# Patient Record
Sex: Female | Born: 1965 | Race: White | Hispanic: No | Marital: Married | Smoking: Never smoker
Health system: Southern US, Community
[De-identification: ages and names within clinical notes are randomized; demographics above are authoritative.]

## PROBLEM LIST (undated history)

## (undated) DIAGNOSIS — N83209 Unspecified ovarian cyst, unspecified side: Secondary | ICD-10-CM

## (undated) DIAGNOSIS — E669 Obesity, unspecified: Secondary | ICD-10-CM

## (undated) DIAGNOSIS — T7840XA Allergy, unspecified, initial encounter: Secondary | ICD-10-CM

## (undated) DIAGNOSIS — M1990 Unspecified osteoarthritis, unspecified site: Secondary | ICD-10-CM

## (undated) DIAGNOSIS — F419 Anxiety disorder, unspecified: Secondary | ICD-10-CM

## (undated) DIAGNOSIS — I10 Essential (primary) hypertension: Secondary | ICD-10-CM

## (undated) HISTORY — DX: Essential (primary) hypertension: I10

## (undated) HISTORY — DX: Allergy, unspecified, initial encounter: T78.40XA

## (undated) HISTORY — DX: Obesity, unspecified: E66.9

## (undated) HISTORY — DX: Unspecified osteoarthritis, unspecified site: M19.90

## (undated) HISTORY — PX: TUBAL LIGATION: SHX77

## (undated) HISTORY — PX: OOPHORECTOMY: SHX86

## (undated) HISTORY — DX: Unspecified ovarian cyst, unspecified side: N83.209

## (undated) HISTORY — DX: Anxiety disorder, unspecified: F41.9

---

## 2004-12-21 ENCOUNTER — Ambulatory Visit: Payer: Self-pay

## 2010-06-18 ENCOUNTER — Ambulatory Visit: Payer: Self-pay

## 2010-06-29 ENCOUNTER — Ambulatory Visit: Payer: Self-pay | Admitting: Internal Medicine

## 2010-11-26 ENCOUNTER — Ambulatory Visit: Payer: Self-pay | Admitting: Internal Medicine

## 2011-07-13 ENCOUNTER — Ambulatory Visit: Payer: Self-pay | Admitting: Internal Medicine

## 2012-07-27 ENCOUNTER — Ambulatory Visit: Payer: Self-pay | Admitting: Family Medicine

## 2013-04-10 LAB — HM PAP SMEAR

## 2013-12-24 ENCOUNTER — Ambulatory Visit: Payer: Self-pay | Admitting: Family Medicine

## 2013-12-30 ENCOUNTER — Ambulatory Visit: Payer: Self-pay | Admitting: Family Medicine

## 2014-04-04 ENCOUNTER — Ambulatory Visit: Payer: Self-pay | Admitting: Family Medicine

## 2014-04-04 LAB — HM MAMMOGRAPHY

## 2015-08-27 DIAGNOSIS — I10 Essential (primary) hypertension: Secondary | ICD-10-CM | POA: Insufficient documentation

## 2015-08-27 DIAGNOSIS — E669 Obesity, unspecified: Secondary | ICD-10-CM | POA: Insufficient documentation

## 2015-08-27 DIAGNOSIS — I152 Hypertension secondary to endocrine disorders: Secondary | ICD-10-CM | POA: Insufficient documentation

## 2015-08-29 ENCOUNTER — Ambulatory Visit: Payer: Self-pay | Admitting: Family Medicine

## 2015-09-01 ENCOUNTER — Encounter: Payer: Self-pay | Admitting: Family Medicine

## 2015-09-01 ENCOUNTER — Ambulatory Visit (INDEPENDENT_AMBULATORY_CARE_PROVIDER_SITE_OTHER): Payer: No Typology Code available for payment source | Admitting: Family Medicine

## 2015-09-01 VITALS — BP 147/86 | HR 74 | Temp 98.1°F | Ht 63.8 in | Wt 219.0 lb

## 2015-09-01 DIAGNOSIS — R399 Unspecified symptoms and signs involving the genitourinary system: Secondary | ICD-10-CM

## 2015-09-01 DIAGNOSIS — I10 Essential (primary) hypertension: Secondary | ICD-10-CM | POA: Diagnosis not present

## 2015-09-01 DIAGNOSIS — N898 Other specified noninflammatory disorders of vagina: Secondary | ICD-10-CM

## 2015-09-01 DIAGNOSIS — L298 Other pruritus: Secondary | ICD-10-CM | POA: Diagnosis not present

## 2015-09-01 DIAGNOSIS — R899 Unspecified abnormal finding in specimens from other organs, systems and tissues: Secondary | ICD-10-CM | POA: Diagnosis not present

## 2015-09-01 MED ORDER — NYSTATIN 100000 UNIT/GM EX CREA: 1.0000 "application " | TOPICAL_CREAM | Freq: Two times a day (BID) | CUTANEOUS | Status: DC

## 2015-09-01 NOTE — Patient Instructions (Addendum)
Use cotton panties Let things air out Use the new cream Return in one month for recheck Try the DASH guidelines DASH Eating Plan DASH stands for "Dietary Approaches to Stop Hypertension." The DASH eating plan is a healthy eating plan that has been shown to reduce high blood pressure (hypertension). Additional health benefits may include reducing the risk of type 2 diabetes mellitus, heart disease, and stroke. The DASH eating plan may also help with weight loss. WHAT DO I NEED TO KNOW ABOUT THE DASH EATING PLAN? For the DASH eating plan, you will follow these general guidelines:  Choose foods with a percent daily value for sodium of less than 5% (as listed on the food label).  Use salt-free seasonings or herbs instead of table salt or sea salt.  Check with your health care provider or pharmacist before using salt substitutes.  Eat lower-sodium products, often labeled as "lower sodium" or "no salt added."  Eat fresh foods.  Eat more vegetables, fruits, and low-fat dairy products.  Choose whole grains. Look for the word "whole" as the first word in the ingredient list.  Choose fish and skinless chicken or [place] more often than red meat. Limit fish, poultry, and meat to 6 oz (170 g) each day.  Limit sweets, desserts, sugars, and sugary drinks.  Choose heart-healthy fats.  Limit cheese to 1 oz (28 g) per day.  Eat more home-cooked food and less restaurant, buffet, and fast food.  Limit fried foods.  Cook foods using methods other than frying.  Limit canned vegetables. If you do use them, rinse them well to decrease the sodium.  When eating at a restaurant, ask that your food be prepared with less salt, or no salt if possible. WHAT FOODS CAN I EAT? Seek help from a dietitian for individual calorie needs. Grains Whole grain or whole wheat bread. Brown rice. Whole grain or whole wheat pasta. Quinoa, bulgur, and whole grain cereals. Low-sodium cereals. Corn or whole wheat flour  tortillas. Whole grain cornbread. Whole grain crackers. Low-sodium crackers. Vegetables Fresh or frozen vegetables (raw, steamed, roasted, or grilled). Low-sodium or reduced-sodium tomato and vegetable juices. Low-sodium or reduced-sodium tomato sauce and paste. Low-sodium or reduced-sodium canned vegetables.  Fruits All fresh, canned (in natural juice), or frozen fruits. Meat and Other Protein Products Ground beef (85% or leaner), grass-fed beef, or beef trimmed of fat. Skinless chicken or [place]. Ground chicken or [place]key. Pork trimmed of fat. All fish and seafood. Eggs. Dried beans, peas, or lentils. Unsalted nuts and seeds. Unsalted canned beans. Dairy Low-fat dairy products, such as skim or 1% milk, 2% or reduced-fat cheeses, low-fat ricotta or cottage cheese, or plain low-fat yogurt. Low-sodium or reduced-sodium cheeses. Fats and Oils Tub margarines without trans fats. Light or reduced-fat mayonnaise and salad dressings (reduced sodium). Avocado. Safflower, olive, or canola oils. Natural peanut or almond butter. Other Unsalted popcorn and pretzels. The items listed above may not be a complete list of recommended foods or beverages. Contact your dietitian for more options. WHAT FOODS ARE NOT RECOMMENDED? Grains White bread. White pasta. White rice. Refined cornbread. Bagels and croissants. Crackers that contain trans fat. Vegetables Creamed or fried vegetables. Vegetables in a cheese sauce. Regular canned vegetables. Regular canned tomato sauce and paste. Regular tomato and vegetable juices. Fruits Dried fruits. Canned fruit in light or heavy syrup. Fruit juice. Meat and Other Protein Products Fatty cuts of meat. Ribs, chicken wings, bacon, sausage, bologna, salami, chitterlings, fatback, hot dogs, bratwurst, and packaged luncheon meats. Salted nuts  and seeds. Canned beans with salt. Dairy Whole or 2% milk, cream, half-and-half, and cream cheese. Whole-fat or sweetened yogurt. Full-fat  cheeses or blue cheese. Nondairy creamers and whipped toppings. Processed cheese, cheese spreads, or cheese curds. Condiments Onion and garlic salt, seasoned salt, table salt, and sea salt. Canned and packaged gravies. Worcestershire sauce. Tartar sauce. Barbecue sauce. Teriyaki sauce. Soy sauce, including reduced sodium. Steak sauce. Fish sauce. Oyster sauce. Cocktail sauce. Horseradish. Ketchup and mustard. Meat flavorings and tenderizers. Bouillon cubes. Hot sauce. Tabasco sauce. Marinades. Taco seasonings. Relishes. Fats and Oils Butter, stick margarine, lard, shortening, ghee, and bacon fat. Coconut, palm kernel, or palm oils. Regular salad dressings. Other Pickles and olives. Salted popcorn and pretzels. The items listed above may not be a complete list of foods and beverages to avoid. Contact your dietitian for more information. WHERE CAN I FIND MORE INFORMATION? National Heart, Lung, and Blood Institute: travelstabloid.com   This information is not intended to replace advice given to you by your health care provider. Make sure you discuss any questions you have with your health care provider.   Document Released: 10/07/2011 Document Revised: 11/08/2014 Document Reviewed: 08/22/2013 Elsevier Interactive Patient Education [employer].

## 2015-09-01 NOTE — Progress Notes (Signed)
BP 147/86 mmHg  Pulse 74  Temp(Src) 98.1 F (36.7 C)  Ht 5' 3.8" (1.621 m)  Wt 219 lb (99.338 kg)  BMI 37.81 kg/m2  SpO2 97%  LMP 08/05/2015 (Approximate)   Subjective:    Patient ID: Felicia Everett, female    DOB: 1965-12-13, 49 y.o.   MRN: [id number]  HPI: Felicia Everett is a 49 y.o. female  Chief Complaint  Patient presents with  . Vaginitis   She has noticed something some issues down below for 4-5 months; rash on the outside; not any itching or burning on the inside; used some baby powder and it helped; still a little pink but not red and itchy; no pelvic pain or pressure; she did not have a period for about 8 months, then got a period in Sept and October; regular periods, not weird spotting; no burning with urination; BMs are regular; no fevers; just a little nauseated this month; sexually active, but has tubes tied  Her BP is up just a little; had a hot dog and Dr. Reino Kent not long ago; checking BP at home, not taking any BP right now; taking aleve for stress fracture; check at home once a week, 134/76 usually  She has a stress fracture on her heel; Dr. Fransisco Hertz; right foot; wears a boot at work, not right now  She has gained weight, not as mobile with stress fracture; no really dry mouth; not urinating at night  Relevant past medical, surgical, family and social history reviewed and updated as indicated. Interim medical history since our last visit reviewed. Allergies and medications reviewed and updated.  Review of Systems Per HPI unless specifically indicated above     Objective:    BP 147/86 mmHg  Pulse 74  Temp(Src) 98.1 F (36.7 C)  Ht 5' 3.8" (1.621 m)  Wt 219 lb (99.338 kg)  BMI 37.81 kg/m2  SpO2 97%  LMP 08/05/2015 (Approximate)  Wt Readings from Last 3 Encounters:  09/01/15 219 lb (99.338 kg)  06/28/14 208 lb (94.348 kg)    Physical Exam  Constitutional: She appears well-developed and well-nourished.  obese  Cardiovascular: Normal rate.    Pulmonary/Chest: Effort normal and breath sounds normal.  Abdominal: Soft. Bowel sounds are normal. She exhibits no distension. There is no tenderness. There is no guarding.  Genitourinary: There is rash on the right labia. There is rash on the left labia. Vaginal discharge (very scant, clear, no odor) found.  Erythematous tiny excoriations and rash on the labia majora superiorly; nestled in the vestibule, labia minora, there is slightly macerated, lightly colored (but not white) tissue, very slightly papular with clear margins; no erythema, no vesicular component; no fimbriae  Musculoskeletal: She exhibits no edema.  Skin: No rash noted.  Psychiatric: She has a normal mood and affect.    Results for orders placed or performed in visit on 09/01/15  WET PREP FOR TRICH, YEAST, CLUE  Result Value Ref Range   Trichomonas Exam Negative Negative   Yeast Exam Negative Negative   Clue Cell Exam Negative Negative      Assessment & Plan:   Problem List Items Addressed This Visit      Cardiovascular and Mediastinum   Hypertension    Not controlled; DASH guidelines recommended; weigh tloss, healthier eating; recheck in one month        Musculoskeletal and Integument   Vaginal itching - Primary    External irritation; start Rx, avoid excessive moisture, friction, return for re-evaluation in one month,  sooner if needed      Relevant Orders   WET PREP FOR TRICH, YEAST, CLUE (Completed)   Ambulatory referral to Gynecology     Other   Abnormal genitourinary finding    Tissue in the vaginal vestibule, cleft along labia minora appears macerated, but not verrucous; very symmetric, "kissing" type lesions; does not appear typical for condyloma or LSA; will have her use new cream to treat the external itching; use cotton panties, see if symptoms are from excessive moisture; not typical for condyloma; I initially thought I would just re-exam in one month (since she says this has been going on for  about 5 months, not a rapid evolution, apparently); work on weight loss; however, will refer to GYN for evaluation, consideration of biopsy; she agrees with treatment recommendations; avoid use of talcum powder      Relevant Orders   Ambulatory referral to Gynecology   RESOLVED: Vaginal discharge      Follow up plan: Return in about 1 month (around 10/01/2015) for recheck.  An after-visit summary was printed and given to the patient at check-out.  Please see the patient instructions which may contain other information and recommendations beyond what is mentioned above in the assessment and plan.

## 2015-09-02 DIAGNOSIS — N83209 Unspecified ovarian cyst, unspecified side: Secondary | ICD-10-CM

## 2015-09-02 DIAGNOSIS — N898 Other specified noninflammatory disorders of vagina: Secondary | ICD-10-CM | POA: Insufficient documentation

## 2015-09-02 HISTORY — DX: Unspecified ovarian cyst, unspecified side: N83.209

## 2015-09-02 LAB — WET PREP FOR TRICH, YEAST, CLUE
Clue Cell Exam: NEGATIVE
Trichomonas Exam: NEGATIVE
YEAST EXAM: NEGATIVE

## 2015-09-04 DIAGNOSIS — R399 Unspecified symptoms and signs involving the genitourinary system: Secondary | ICD-10-CM | POA: Insufficient documentation

## 2015-09-04 NOTE — Assessment & Plan Note (Addendum)
Not controlled; DASH guidelines recommended; weight loss, healthier eating; recheck in one month; NSAID likely not helping

## 2015-09-04 NOTE — Assessment & Plan Note (Addendum)
Tissue in the vaginal vestibule, cleft along labia minora appears macerated, but not verrucous; very symmetric, "kissing" type lesions; does not appear typical for condyloma or LSA; will have her use new cream to treat the external itching; use cotton panties, see if symptoms are from excessive moisture; not typical for condyloma; I initially thought I would just re-exam in one month (since she says this has been going on for about 5 months, not a rapid evolution, apparently); work on weight loss; however, will refer to GYN for evaluation, consideration of biopsy; she agrees with treatment recommendations; avoid use of talcum powder

## 2015-09-04 NOTE — Assessment & Plan Note (Signed)
External irritation; start Rx, avoid excessive moisture, friction, return for re-evaluation in one month, sooner if needed

## 2015-09-12 ENCOUNTER — Telehealth: Payer: Self-pay

## 2015-09-12 NOTE — Telephone Encounter (Signed)
Patient called to advise that she will not be going to the [facility].  Patient states that she would [facility] and that she would prefer to schedule her own appointment.  Patient states that she will keep her follow up here on 10/01/15 but probably will not go to the GYN until after the first of the year.

## 2015-10-01 ENCOUNTER — Ambulatory Visit: Payer: Managed Care, Other (non HMO) | Admitting: Family Medicine

## 2015-10-22 ENCOUNTER — Encounter: Payer: Managed Care, Other (non HMO) | Admitting: Obstetrics and Gynecology

## 2016-06-03 ENCOUNTER — Encounter: Payer: Self-pay | Admitting: Family Medicine

## 2016-06-03 ENCOUNTER — Ambulatory Visit (INDEPENDENT_AMBULATORY_CARE_PROVIDER_SITE_OTHER): Payer: Managed Care, Other (non HMO) | Admitting: Family Medicine

## 2016-06-03 VITALS — Temp 98.3°F | Wt 223.0 lb

## 2016-06-03 DIAGNOSIS — R42 Dizziness and giddiness: Secondary | ICD-10-CM | POA: Diagnosis not present

## 2016-06-03 DIAGNOSIS — R252 Cramp and spasm: Secondary | ICD-10-CM | POA: Diagnosis not present

## 2016-06-03 NOTE — Patient Instructions (Signed)
Follow up as needed

## 2016-06-03 NOTE — Progress Notes (Signed)
Temp 98.3 F (36.8 C)   Wt 223 lb (101.2 kg)   SpO2 95%   BMI 38.52 kg/m    Subjective:    Patient ID: Felicia Everett, female    DOB: [birth year], 50 y.o.   MRN: [id number]  HPI: Felicia Everett is a 50 y.o. female  Chief Complaint  Patient presents with  . Dizziness    since saturday late, nauseated, shakey, dizzy (no room spinning), fatigue. Right shoulder/axillary pain. Also dieting and skipping meals.  . Other    she's been having some abdominal, hands, and leg cramps. Has had low vitamin levels in the past.   Patient presents with a 5 day history of nausea, dizziness, fatigue, and muscle cramping. Did have a few quick episodes of sharp R sided chest/axilla pain as well. Denies feeling like the room is spinning, but states the dizziness mostly happens upon standing up from sitting. Only change of late has been that she decided she wanted to drop weight quickly, so she has been eating very minimal food the past week or two and not drinking much water as her job is busy and does not allow her to keep water nearby. Will eat maybe 500 calories a day now. Not taking anything OTC for symptoms.   Relevant past medical, surgical, family and social history reviewed and updated as indicated. Interim medical history since our last visit reviewed. Allergies and medications reviewed and updated.  Past Medical History:  Diagnosis Date  . Hypertension   . Obesity    Social History   Social History  . Marital status: Married    Spouse name: N/A  . Number of children: N/A  . Years of education: N/A   Occupational History  . Not on file.   Social History Main Topics  . Smoking status: Never Smoker  . Smokeless tobacco: Never Used  . Alcohol use No  . Drug use: No  . Sexual activity: Not on file   Other Topics Concern  . Not on file   Social History Narrative  . No narrative on file   Review of Systems  Constitutional: Positive for fatigue.  HENT: Negative.   Respiratory:  Negative.   Cardiovascular: Positive for chest pain.  Gastrointestinal: Positive for nausea.  Genitourinary: Negative.   Musculoskeletal: Positive for myalgias.  Neurological: Positive for dizziness.  Psychiatric/Behavioral: Negative.     Per HPI unless specifically indicated above     Objective:    Temp 98.3 F (36.8 C)   Wt 223 lb (101.2 kg)   SpO2 95%   BMI 38.52 kg/m   Wt Readings from Last 3 Encounters:  06/03/16 223 lb (101.2 kg)  09/01/15 219 lb (99.3 kg)  06/28/14 208 lb (94.3 kg)    Physical Exam  Constitutional: She is oriented to person, place, and time. She appears well-developed and well-nourished.  HENT:  Head: Atraumatic.  Eyes: Conjunctivae are normal. No scleral icterus.  Neck: Normal range of motion. Neck supple.  Cardiovascular: Normal rate, regular rhythm and normal heart sounds.   Pulmonary/Chest: Effort normal and breath sounds normal.  Abdominal: Soft. Bowel sounds are normal. There is no tenderness.  Musculoskeletal: Normal range of motion.  Neurological: She is alert and oriented to person, place, and time.  Skin: Skin is warm and dry.  Psychiatric: She has a normal mood and affect. Her behavior is normal.  Nursing note and vitals reviewed.  EKG and orthostatics benign.     Assessment & Plan:  Problem List Items Addressed This Visit    None    Visit Diagnoses    Muscle cramp    -  Primary   Relevant Orders   CBC with Differential/Platelet   Comprehensive metabolic panel   Vitamin B12   VITAMIN D 25 Hydroxy (Vit-D Deficiency, Fractures)   Magnesium   Dizziness       Relevant Orders   EKG 12-Lead (Completed)    Likely an orthostatic component given poor hydration status recently, possibly some hypoglycemic episodes as well d/t poor PO intake the past week or so. Await lab results. Discussed with patient the importance of taking in appropriate calories and nutrients, patient agreeable to improving current dietary habits. Also  encouraged better water intake to stay hydrated. Patient declined nutrition referral at this time as she saw one last year.   Follow up plan: Return if symptoms worsen or fail to improve.  Schedule PE for patient's earliest convenience.

## 2016-06-04 LAB — VITAMIN B12: Vitamin B-12: 550 pg/mL (ref 211–946)

## 2016-06-04 LAB — COMPREHENSIVE METABOLIC PANEL
A/G RATIO: 1.2 (ref 1.2–2.2)
ALBUMIN: 4.2 g/dL (ref 3.5–5.5)
ALT: 26 IU/L (ref 0–32)
AST: 20 IU/L (ref 0–40)
Alkaline Phosphatase: 76 IU/L (ref 39–117)
BILIRUBIN TOTAL: 0.3 mg/dL (ref 0.0–1.2)
BUN / CREAT RATIO: 23 (ref 9–23)
BUN: 12 mg/dL (ref 6–24)
CALCIUM: 9.6 mg/dL (ref 8.7–10.2)
CHLORIDE: 101 mmol/L (ref 96–106)
CO2: 26 mmol/L (ref 18–29)
Creatinine, Ser: 0.53 mg/dL — ABNORMAL LOW (ref 0.57–1.00)
GFR, EST AFRICAN AMERICAN: 129 mL/min/{1.73_m2} (ref >59)
GFR, EST NON AFRICAN AMERICAN: 112 mL/min/{1.73_m2} (ref >59)
GLOBULIN, TOTAL: 3.4 g/dL (ref 1.5–4.5)
Glucose: 113 mg/dL — ABNORMAL HIGH (ref 65–99)
POTASSIUM: 4.4 mmol/L (ref 3.5–5.2)
SODIUM: 142 mmol/L (ref 134–144)
TOTAL PROTEIN: 7.6 g/dL (ref 6.0–8.5)

## 2016-06-04 LAB — CBC WITH DIFFERENTIAL/PLATELET
BASOS: 0 %
Basophils Absolute: 0 10*3/uL (ref 0.0–0.2)
EOS (ABSOLUTE): 0.2 10*3/uL (ref 0.0–0.4)
EOS: 2 %
HEMATOCRIT: 38.2 % (ref 34.0–46.6)
Hemoglobin: 12.6 g/dL (ref 11.1–15.9)
IMMATURE GRANS (ABS): 0 10*3/uL (ref 0.0–0.1)
Immature Granulocytes: 0 %
LYMPHS: 22 %
Lymphocytes Absolute: 2.2 10*3/uL (ref 0.7–3.1)
MCH: 29.4 pg (ref 26.6–33.0)
MCHC: 33 g/dL (ref 31.5–35.7)
MCV: 89 fL (ref 79–97)
Monocytes Absolute: 0.7 10*3/uL (ref 0.1–0.9)
Monocytes: 7 %
NEUTROS ABS: 7 10*3/uL (ref 1.4–7.0)
Neutrophils: 69 %
PLATELETS: 302 10*3/uL (ref 150–379)
RBC: 4.28 x10E6/uL (ref 3.77–5.28)
RDW: 14.1 % (ref 12.3–15.4)
WBC: 10.1 10*3/uL (ref 3.4–10.8)

## 2016-06-04 LAB — VITAMIN D 25 HYDROXY (VIT D DEFICIENCY, FRACTURES): Vit D, 25-Hydroxy: 15.2 ng/mL — ABNORMAL LOW (ref 30.0–100.0)

## 2016-06-04 LAB — MAGNESIUM: Magnesium: 2 mg/dL (ref 1.6–2.3)

## 2016-06-07 ENCOUNTER — Telehealth: Payer: Self-pay | Admitting: Family Medicine

## 2016-06-07 ENCOUNTER — Encounter: Payer: Self-pay | Admitting: Family Medicine

## 2016-06-07 NOTE — Telephone Encounter (Signed)
Please let her know that all of her labs came back normal other than her Vitamin D level was low, which likely is just an incidental finding. Have her start an OTC Vitamin D3 supplement and continue working on good dietary habits. Thanks!

## 2016-06-07 NOTE — Telephone Encounter (Signed)
Patient notified

## 2016-08-02 ENCOUNTER — Encounter: Payer: Managed Care, Other (non HMO) | Admitting: Family Medicine

## 2016-11-30 ENCOUNTER — Ambulatory Visit (INDEPENDENT_AMBULATORY_CARE_PROVIDER_SITE_OTHER): Payer: Managed Care, Other (non HMO) | Admitting: Family Medicine

## 2016-11-30 ENCOUNTER — Encounter: Payer: Self-pay | Admitting: Family Medicine

## 2016-11-30 VITALS — BP 142/89 | HR 93 | Temp 98.3°F | Wt 229.0 lb

## 2016-11-30 DIAGNOSIS — L253 Unspecified contact dermatitis due to other chemical products: Secondary | ICD-10-CM

## 2016-11-30 DIAGNOSIS — K219 Gastro-esophageal reflux disease without esophagitis: Secondary | ICD-10-CM

## 2016-11-30 MED ORDER — OMEPRAZOLE 20 MG PO CPDR: 20.0000 mg | DELAYED_RELEASE_CAPSULE | Freq: Every day | ORAL | 3 refills | Status: DC

## 2016-11-30 MED ORDER — TRIAMCINOLONE ACETONIDE 0.1 % EX CREA: 1.0000 "application " | TOPICAL_CREAM | Freq: Two times a day (BID) | CUTANEOUS | 0 refills | Status: DC

## 2016-11-30 MED ORDER — TRIAMCINOLONE ACETONIDE 40 MG/ML IJ SUSP
40.0000 mg | Freq: Once | INTRAMUSCULAR | Status: AC
  Administered 2016-11-30: 40 mg via INTRAMUSCULAR

## 2016-11-30 NOTE — Patient Instructions (Signed)
Follow up as needed

## 2016-11-30 NOTE — Progress Notes (Signed)
BP (!) 142/89   Pulse 93   Temp 98.3 F (36.8 C)   Wt 229 lb (103.9 kg)   SpO2 97%   BMI 39.55 kg/m    Subjective:    Patient ID: Felicia Everett, female    DOB: [birth year], 51 y.o.   MRN: [id number]  HPI: Felicia Everett is a 51 y.o. female  Chief Complaint  Patient presents with  . Rash    x 2 weeks, face and ears only, itches, redness. Can't recall if she used a different makeup.   Patient presents with 2 day history of red, itchy rash on face. No new products, medications, food, or detergents. Does note that the day before she noticed the rash she had been blowing up a bunch of balloons for a baby shower and she has a latex allergy so she thinks that may have been the cause. Has been using hydrocortisone cream with minimal relief.   Also c/o new onset burning and brash in throat that is worst when laying down. Has never had reflux before but feels this is what is going on. Denies CP, SOB, abdominal pain, changes in stools. Tried some TUMS with some relief. Has been under stress lately and knows she isn't eating right.   Past Medical History:  Diagnosis Date  . Hypertension   . Obesity   . Ovarian cyst 09/2015   right side   Social History   Social History  . Marital status: Married    Spouse name: N/A  . Number of children: N/A  . Years of education: N/A   Occupational History  . Not on file.   Social History Main Topics  . Smoking status: Never Smoker  . Smokeless tobacco: Never Used  . Alcohol use No  . Drug use: No  . Sexual activity: Not on file   Other Topics Concern  . Not on file   Social History Narrative  . No narrative on file    Relevant past medical, surgical, family and social history reviewed and updated as indicated. Interim medical history since our last visit reviewed. Allergies and medications reviewed and updated.  Review of Systems  Constitutional: Negative.   HENT: Negative.   Eyes: Negative.   Respiratory: Negative.     Cardiovascular: Negative.   Gastrointestinal:       Esophageal discomfort, brash  Genitourinary: Negative.   Musculoskeletal: Negative.   Skin: Positive for rash.  Neurological: Negative.   Psychiatric/Behavioral: Negative.     Per HPI unless specifically indicated above     Objective:    BP (!) 142/89   Pulse 93   Temp 98.3 F (36.8 C)   Wt 229 lb (103.9 kg)   SpO2 97%   BMI 39.55 kg/m   Wt Readings from Last 3 Encounters:  11/30/16 229 lb (103.9 kg)  06/03/16 223 lb (101.2 kg)  09/01/15 219 lb (99.3 kg)    Physical Exam  Constitutional: She is oriented to person, place, and time. She appears well-developed and well-nourished. No distress.  HENT:  Head: Atraumatic.  Mouth/Throat: Oropharynx is clear and moist.  Eyes: Conjunctivae are normal. Pupils are equal, round, and reactive to light. No scleral icterus.  Neck: Normal range of motion. Neck supple.  Cardiovascular: Normal rate and normal heart sounds.   Pulmonary/Chest: Effort normal and breath sounds normal. No respiratory distress.  Abdominal: Soft. Bowel sounds are normal. She exhibits no distension. There is no tenderness.  Musculoskeletal: Normal range of motion.  Neurological:  She is alert and oriented to person, place, and time.  Skin: Skin is warm and dry. Rash (Diffuse erythematous macular rash focused around mouth and across forehead and eyelids) noted.  Psychiatric: She has a normal mood and affect. Her behavior is normal.  Nursing note and vitals reviewed.     Assessment & Plan:   Problem List Items Addressed This Visit    None    Visit Diagnoses    Allergic contact dermatitis due to latex    -  Primary   IM kenalog given today, triamcinolone cream sent. Recommended taking benadryl prn. Avoid latex products. F/u if no improvement   Relevant Medications   triamcinolone acetonide (KENALOG-40) injection 40 mg (Completed)   Gastroesophageal reflux disease, esophagitis presence not specified        Suspect having some gastritis as this is a new issue. Prilosec sent. Discussed taking 30 min before meal in morning. Can continue PRN zantac or TUMS   Relevant Medications   omeprazole (PRILOSEC) 20 MG capsule       Follow up plan: Return if symptoms worsen or fail to improve.

## 2018-09-26 ENCOUNTER — Encounter: Payer: Managed Care, Other (non HMO) | Admitting: Family Medicine

## 2018-11-20 ENCOUNTER — Ambulatory Visit: Payer: Self-pay

## 2020-01-16 ENCOUNTER — Ambulatory Visit: Payer: Self-pay | Admitting: Family Medicine

## 2020-01-23 ENCOUNTER — Ambulatory Visit (INDEPENDENT_AMBULATORY_CARE_PROVIDER_SITE_OTHER): Payer: Self-pay | Admitting: Family Medicine

## 2020-01-23 ENCOUNTER — Encounter: Payer: Self-pay | Admitting: Family Medicine

## 2020-01-23 ENCOUNTER — Other Ambulatory Visit: Payer: Self-pay

## 2020-01-23 ENCOUNTER — Telehealth: Payer: Self-pay | Admitting: Family Medicine

## 2020-01-23 VITALS — BP 164/94 | HR 102 | Temp 98.2°F | Ht 62.5 in | Wt 214.0 lb

## 2020-01-23 DIAGNOSIS — I10 Essential (primary) hypertension: Secondary | ICD-10-CM

## 2020-01-23 DIAGNOSIS — Z7689 Persons encountering health services in other specified circumstances: Secondary | ICD-10-CM

## 2020-01-23 DIAGNOSIS — F419 Anxiety disorder, unspecified: Secondary | ICD-10-CM

## 2020-01-23 NOTE — Telephone Encounter (Signed)
Please see where she needs order sent  Copied from CRM [id number]. Topic: General - Other >> Jan 23, 2020 12:10 PM Dalphine Handing A wrote: Patient stated that she needs [facility] to place order for mammogram she has scheduled on 3/31. Patient requesting callback once this has been done. Please advise

## 2020-01-23 NOTE — Progress Notes (Signed)
BP (!) 164/94   Pulse (!) 102   Temp 98.2 F (36.8 C) (Oral)   Ht 5' 2.5" (1.588 m)   Wt 214 lb (97.1 kg)   LMP 08/05/2015 (Approximate)   SpO2 98%   BMI 38.52 kg/m    Subjective:    Patient ID: Felicia Everett, female    DOB: 12/26/1965, 54 y.o.   MRN: [id number]  HPI: Felicia Everett is a 54 y.o. female  Chief Complaint  Patient presents with  . Establish Care    pt would like to discuss about allergies and get a mammogram  . Anxiety   Patient presenting today to establish care.   Having some issues with anxiety since her daughter's issues with drug abuse and taking custody of young grandson. Has been going to rehabilitation counseling here and there. Has not tried any medication for this issue. Denies SI/HI, frequent panic attacks.   No known past medical history other than allergic rhinitis for which she takes claritin prn with good control and some intermittent issues with high blood pressures in the past.   PHQ-9 and GAD7 were not readable to input into patient's chart, will try to re-obtain at next OV No flowsheet data found.  No flowsheet data found.  Relevant past medical, surgical, family and social history reviewed and updated as indicated. Interim medical history since our last visit reviewed. Allergies and medications reviewed and updated.  Review of Systems  Per HPI unless specifically indicated above     Objective:    BP (!) 164/94   Pulse (!) 102   Temp 98.2 F (36.8 C) (Oral)   Ht 5' 2.5" (1.588 m)   Wt 214 lb (97.1 kg)   LMP 08/05/2015 (Approximate)   SpO2 98%   BMI 38.52 kg/m   Wt Readings from Last 3 Encounters:  01/23/20 214 lb (97.1 kg)  11/30/16 229 lb (103.9 kg)  06/03/16 223 lb (101.2 kg)    Physical Exam Vitals and nursing note reviewed.  Constitutional:      Appearance: Normal appearance. She is not ill-appearing.  HENT:     Head: Atraumatic.  Eyes:     Extraocular Movements: Extraocular movements intact.   Conjunctiva/sclera: Conjunctivae normal.  Cardiovascular:     Rate and Rhythm: Normal rate and regular rhythm.     Heart sounds: Normal heart sounds.  Pulmonary:     Effort: Pulmonary effort is normal.     Breath sounds: Normal breath sounds.  Musculoskeletal:        General: Normal range of motion.     Cervical back: Normal range of motion and neck supple.  Skin:    General: Skin is warm and dry.  Neurological:     Mental Status: She is alert and oriented to person, place, and time.  Psychiatric:        Mood and Affect: Mood normal.        Thought Content: Thought content normal.        Judgment: Judgment normal.     Results for orders placed or performed in visit on 06/03/16  CBC with Differential/Platelet  Result Value Ref Range   WBC 10.1 3.4 - 10.8 x10E3/uL   RBC 4.28 3.77 - 5.28 x10E6/uL   Hemoglobin 12.6 11.1 - 15.9 g/dL   Hematocrit 38.2 34.0 - 46.6 %   MCV 89 79 - 97 fL   MCH 29.4 26.6 - 33.0 pg   MCHC 33.0 31.5 - 35.7 g/dL   RDW 14.1  12.3 - 15.4 %   Platelets 302 150 - 379 x10E3/uL   Neutrophils 69 %   Lymphs 22 %   Monocytes 7 %   Eos 2 %   Basos 0 %   Neutrophils Absolute 7.0 1.4 - 7.0 x10E3/uL   Lymphocytes Absolute 2.2 0.7 - 3.1 x10E3/uL   Monocytes Absolute 0.7 0.1 - 0.9 x10E3/uL   EOS (ABSOLUTE) 0.2 0.0 - 0.4 x10E3/uL   Basophils Absolute 0.0 0.0 - 0.2 x10E3/uL   Immature Granulocytes 0 %   Immature Grans (Abs) 0.0 0.0 - 0.1 x10E3/uL  Comprehensive metabolic panel  Result Value Ref Range   Glucose 113 (H) 65 - 99 mg/dL   BUN 12 6 - 24 mg/dL   Creatinine, Ser 8.29 (L) 0.57 - 1.00 mg/dL   GFR calc non Af Amer 112 >59 mL/min/1.73   GFR calc Af Amer 129 >59 mL/min/1.73   BUN/Creatinine Ratio 23 9 - 23   Sodium 142 134 - 144 mmol/L   Potassium 4.4 3.5 - 5.2 mmol/L   Chloride 101 96 - 106 mmol/L   CO2 26 18 - 29 mmol/L   Calcium 9.6 8.7 - 10.2 mg/dL   Total Protein 7.6 6.0 - 8.5 g/dL   Albumin 4.2 3.5 - 5.5 g/dL   Globulin, Total 3.4 1.5 - 4.5  g/dL   Albumin/Globulin Ratio 1.2 1.2 - 2.2   Bilirubin Total 0.3 0.0 - 1.2 mg/dL   Alkaline Phosphatase 76 39 - 117 IU/L   AST 20 0 - 40 IU/L   ALT 26 0 - 32 IU/L  Vitamin B12  Result Value Ref Range   Vitamin B-12 550 211 - 946 pg/mL  VITAMIN D 25 Hydroxy (Vit-D Deficiency, Fractures)  Result Value Ref Range   Vit D, 25-Hydroxy 15.2 (L) 30.0 - 100.0 ng/mL  Magnesium  Result Value Ref Range   Magnesium 2.0 1.6 - 2.3 mg/dL      Assessment & Plan:   Problem List Items Addressed This Visit      Cardiovascular and Mediastinum   Hypertension    Elevated today, but very anxious. Will recheck at upcoming OV. Discussed stress control, diet and exercise in meantime        Other   Anxiety - Primary    Exacerbated by family stressors, discussed increasing counseling involvement and potentially add daily medication. Pt will consider these and discuss further at upcoming visit       Other Visit Diagnoses    Encounter to establish care          40 minutes spent today in direct care and counseling today  Follow up plan: Return for CPE.

## 2020-01-24 ENCOUNTER — Other Ambulatory Visit: Payer: Self-pay | Admitting: Family Medicine

## 2020-01-24 DIAGNOSIS — Z1231 Encounter for screening mammogram for malignant neoplasm of breast: Secondary | ICD-10-CM

## 2020-01-24 NOTE — Telephone Encounter (Signed)
Someone sent the order over to be cosigned which I did so hopefully it's all sorted now

## 2020-01-24 NOTE — Telephone Encounter (Signed)
Done

## 2020-01-24 NOTE — Telephone Encounter (Signed)
Called and spoke to patient. Patient stated she scheduled a mammogram for 01/30/2020  4:40 PM at  [facility]. Is there any oreder in your box to be signed or do we need to fill a form?

## 2020-01-27 DIAGNOSIS — F419 Anxiety disorder, unspecified: Secondary | ICD-10-CM | POA: Insufficient documentation

## 2020-01-27 NOTE — Assessment & Plan Note (Signed)
Elevated today, but very anxious. Will recheck at upcoming OV. Discussed stress control, diet and exercise in meantime

## 2020-01-27 NOTE — Assessment & Plan Note (Signed)
Exacerbated by family stressors, discussed increasing counseling involvement and potentially add daily medication. Pt will consider these and discuss further at upcoming visit

## 2020-01-30 ENCOUNTER — Other Ambulatory Visit: Payer: Self-pay

## 2020-01-30 ENCOUNTER — Ambulatory Visit
Admission: RE | Admit: 2020-01-30 | Discharge: 2020-01-30 | Disposition: A | Discharge status: Home / Self Care | Payer: No Typology Code available for payment source | Source: Ambulatory Visit

## 2020-01-30 DIAGNOSIS — Z1231 Encounter for screening mammogram for malignant neoplasm of breast: Secondary | ICD-10-CM

## 2020-02-22 ENCOUNTER — Encounter: Payer: Self-pay | Admitting: Family Medicine

## 2020-04-16 ENCOUNTER — Other Ambulatory Visit: Payer: Self-pay

## 2020-04-16 ENCOUNTER — Encounter: Payer: Self-pay | Admitting: Family Medicine

## 2020-04-16 ENCOUNTER — Ambulatory Visit (INDEPENDENT_AMBULATORY_CARE_PROVIDER_SITE_OTHER): Payer: Self-pay | Admitting: Family Medicine

## 2020-04-16 VITALS — BP 163/92 | HR 92 | Temp 98.1°F | Wt 211.0 lb

## 2020-04-16 DIAGNOSIS — I10 Essential (primary) hypertension: Secondary | ICD-10-CM

## 2020-04-16 DIAGNOSIS — N760 Acute vaginitis: Secondary | ICD-10-CM

## 2020-04-16 DIAGNOSIS — R350 Frequency of micturition: Secondary | ICD-10-CM

## 2020-04-16 DIAGNOSIS — R81 Glycosuria: Secondary | ICD-10-CM

## 2020-04-16 DIAGNOSIS — B9689 Other specified bacterial agents as the cause of diseases classified elsewhere: Secondary | ICD-10-CM

## 2020-04-16 LAB — WET PREP FOR TRICH, YEAST, CLUE
Clue Cell Exam: POSITIVE — AB
Trichomonas Exam: NEGATIVE
Yeast Exam: NEGATIVE

## 2020-04-16 MED ORDER — SULFAMETHOXAZOLE-TRIMETHOPRIM 800-160 MG PO TABS: 1.0000 | ORAL_TABLET | Freq: Two times a day (BID) | ORAL | 0 refills | Status: DC

## 2020-04-16 MED ORDER — METRONIDAZOLE 500 MG PO TABS: 500.0000 mg | ORAL_TABLET | Freq: Two times a day (BID) | ORAL | 0 refills | Status: DC

## 2020-04-16 NOTE — Progress Notes (Signed)
BP (!) 163/92   Pulse 92   Temp 98.1 F (36.7 C) (Oral)   Wt 211 lb (95.7 kg)   LMP 08/05/2015 (Approximate)   SpO2 97%   BMI 37.98 kg/m    Subjective:    Patient ID: Felicia Everett, female    DOB: 1966-08-10, 54 y.o.   MRN: [id number]  HPI: WALESKA BUTTERY is a 54 y.o. female  Chief Complaint  Patient presents with  . Urinary Frequency    x about 2 weeks  . Dysuria  . Vaginal Itching   2-4 weeks of urinary frequency and the past few days having dysuria and vaginal irritation and itching. Denies N/V, abdominal pain, hematuria, rashes, fevers, chills. Not trying anything OTC other than just trying to increase fluids.   Relevant past medical, surgical, family and social history reviewed and updated as indicated. Interim medical history since our last visit reviewed. Allergies and medications reviewed and updated.  Review of Systems  Per HPI unless specifically indicated above     Objective:    BP (!) 163/92   Pulse 92   Temp 98.1 F (36.7 C) (Oral)   Wt 211 lb (95.7 kg)   LMP 08/05/2015 (Approximate)   SpO2 97%   BMI 37.98 kg/m   Wt Readings from Last 3 Encounters:  04/16/20 211 lb (95.7 kg)  01/23/20 214 lb (97.1 kg)  11/30/16 229 lb (103.9 kg)    Physical Exam Vitals and nursing note reviewed.  Constitutional:      Appearance: Normal appearance. She is not ill-appearing.  HENT:     Head: Atraumatic.  Eyes:     Extraocular Movements: Extraocular movements intact.     Conjunctiva/sclera: Conjunctivae normal.  Cardiovascular:     Rate and Rhythm: Normal rate and regular rhythm.     Heart sounds: Normal heart sounds.  Pulmonary:     Effort: Pulmonary effort is normal.     Breath sounds: Normal breath sounds.  Abdominal:     General: Bowel sounds are normal. There is no distension.     Palpations: Abdomen is soft.     Tenderness: There is no abdominal tenderness. There is no right CVA tenderness, left CVA tenderness or guarding.  Musculoskeletal:          General: Normal range of motion.     Cervical back: Normal range of motion and neck supple.  Skin:    General: Skin is warm and dry.  Neurological:     Mental Status: She is alert and oriented to person, place, and time.  Psychiatric:        Mood and Affect: Mood normal.        Thought Content: Thought content normal.        Judgment: Judgment normal.     Results for orders placed or performed in visit on 04/16/20  WET PREP FOR TRICH, YEAST, CLUE   Specimen: Vaginal; Sterile Swab   Sterile Swab  Result Value Ref Range   Trichomonas Exam Negative Negative   Yeast Exam Negative Negative   Clue Cell Exam Positive (A) Negative  Microscopic Examination   Urine  Result Value Ref Range   WBC, UA 6-10 (A) 0 - 5 /hpf   RBC 11-30 (A) 0 - 2 /hpf   Epithelial Cells (non renal) 0-10 0 - 10 /hpf   Bacteria, UA Few (A) None seen/Few  Urine Culture, Reflex   Urine  Result Value Ref Range   Urine Culture, Routine Final report (A)  Organism ID, Bacteria Citrobacter koseri (A)    Antimicrobial Susceptibility Comment   UA/M w/rflx Culture, Routine   Specimen: Urine   Urine  Result Value Ref Range   Specific Gravity, UA <1.005 (L) 1.005 - 1.030   pH, UA 5.5 5.0 - 7.5   Color, UA Yellow Yellow   Appearance Ur Hazy (A) Clear   Leukocytes,UA Trace (A) Negative   Protein,UA Negative Negative/Trace   Glucose, UA 3+ (A) Negative   Ketones, UA Negative Negative   RBC, UA 3+ (A) Negative   Bilirubin, UA Negative Negative   Urobilinogen, Ur 0.2 0.2 - 1.0 mg/dL   Nitrite, UA Negative Negative   Microscopic Examination See below:    Urinalysis Reflex Comment   HgB A1c  Result Value Ref Range   Hgb A1c MFr Bld 11.3 (H) 4.8 - 5.6 %   Est. average glucose Bld gHb Est-mCnc 278 mg/dL  Basic metabolic panel  Result Value Ref Range   Glucose 405 (H) 65 - 99 mg/dL   BUN 7 6 - 24 mg/dL   Creatinine, Ser 0.66 0.57 - 1.00 mg/dL   GFR calc non Af Amer 101 >59 mL/min/1.73   GFR calc Af Amer  117 >59 mL/min/1.73   BUN/Creatinine Ratio 11 9 - 23   Sodium 134 134 - 144 mmol/L   Potassium 3.9 3.5 - 5.2 mmol/L   Chloride 96 96 - 106 mmol/L   CO2 22 20 - 29 mmol/L   Calcium 9.5 8.7 - 10.2 mg/dL      Assessment & Plan:   Problem List Items Addressed This Visit      Cardiovascular and Mediastinum   Hypertension    Of note, BP elevated today. Patient wishes to work on diet and exercise changes and recheck next time        Musculoskeletal and Integument   Vaginal itching    Other Visit Diagnoses    Frequent urination    -  Primary   Bacteria present, await culture and start bactrim. Also concern for DM given 3+ glucose in urine which could contribute to sxs   Relevant Orders   UA/M w/rflx Culture, Routine (Completed)   Glucosuria       Will check Hg a1c given glucose in urine and sxs today. Treat as needed, and discussed diet and exercise changes   Relevant Orders   HgB A1c (Completed)   Basic metabolic panel (Completed)       Follow up plan: Return in about 3 months (around 07/17/2020) for F/u.

## 2020-04-17 LAB — BASIC METABOLIC PANEL
BUN/Creatinine Ratio: 11 (ref 9–23)
BUN: 7 mg/dL (ref 6–24)
CO2: 22 mmol/L (ref 20–29)
Calcium: 9.5 mg/dL (ref 8.7–10.2)
Chloride: 96 mmol/L (ref 96–106)
Creatinine, Ser: 0.66 mg/dL (ref 0.57–1.00)
GFR calc Af Amer: 117 mL/min/{1.73_m2} (ref >59)
GFR calc non Af Amer: 101 mL/min/{1.73_m2} (ref >59)
Glucose: 405 mg/dL — ABNORMAL HIGH (ref 65–99)
Potassium: 3.9 mmol/L (ref 3.5–5.2)
Sodium: 134 mmol/L (ref 134–144)

## 2020-04-17 LAB — HEMOGLOBIN A1C
Est. average glucose Bld gHb Est-mCnc: 278 mg/dL
Hgb A1c MFr Bld: 11.3 % — ABNORMAL HIGH (ref 4.8–5.6)

## 2020-04-18 ENCOUNTER — Telehealth: Payer: Self-pay | Admitting: Family Medicine

## 2020-04-18 DIAGNOSIS — Z7985 Long-term (current) use of injectable non-insulin antidiabetic drugs: Secondary | ICD-10-CM | POA: Insufficient documentation

## 2020-04-18 DIAGNOSIS — E1165 Type 2 diabetes mellitus with hyperglycemia: Secondary | ICD-10-CM

## 2020-04-18 MED ORDER — METFORMIN HCL 500 MG PO TABS: 500.0000 mg | ORAL_TABLET | Freq: Two times a day (BID) | ORAL | 2 refills | Status: DC

## 2020-04-18 NOTE — Telephone Encounter (Signed)
Rx prepared and placed in folder for signature.

## 2020-04-18 NOTE — Telephone Encounter (Signed)
Called pt to discuss significantly elevated BS and A1C. Discussed starting metformin, diet and exercise changes and rechecking in 3 months. Questions answered, pt agreeable and verbalizes understanding. Already set up for 3 month appt  **Please get a glucometer kit order ready for her to test TID prn**

## 2020-04-19 LAB — MICROSCOPIC EXAMINATION

## 2020-04-19 LAB — UA/M W/RFLX CULTURE, ROUTINE
Bilirubin, UA: NEGATIVE
Ketones, UA: NEGATIVE
Nitrite, UA: NEGATIVE
Protein,UA: NEGATIVE
Specific Gravity, UA: <1.005 — ABNORMAL LOW (ref 1.005–1.030)
Urobilinogen, Ur: 0.2 mg/dL (ref 0.2–1.0)
pH, UA: 5.5 (ref 5.0–7.5)

## 2020-04-19 LAB — URINE CULTURE, REFLEX

## 2020-04-21 NOTE — Assessment & Plan Note (Signed)
Of note, BP elevated today. Patient wishes to work on diet and exercise changes and recheck next time

## 2020-04-21 NOTE — Telephone Encounter (Signed)
Signed.

## 2020-07-11 ENCOUNTER — Encounter: Payer: Self-pay | Admitting: Nurse Practitioner

## 2020-07-18 ENCOUNTER — Ambulatory Visit: Payer: Self-pay | Admitting: Family Medicine

## 2020-07-18 ENCOUNTER — Ambulatory Visit: Payer: Self-pay | Admitting: Nurse Practitioner

## 2020-11-15 ENCOUNTER — Encounter: Payer: Self-pay | Admitting: Nurse Practitioner

## 2020-11-15 DIAGNOSIS — E559 Vitamin D deficiency, unspecified: Secondary | ICD-10-CM | POA: Insufficient documentation

## 2020-11-17 ENCOUNTER — Telehealth: Payer: Self-pay

## 2020-11-17 NOTE — Telephone Encounter (Signed)
lvm we had to cancel apt due to opening late if calls back please reschedule.

## 2020-11-18 ENCOUNTER — Ambulatory Visit: Payer: Self-pay | Admitting: Nurse Practitioner

## 2021-01-10 ENCOUNTER — Other Ambulatory Visit: Payer: Self-pay | Admitting: Nurse Practitioner

## 2021-01-14 ENCOUNTER — Ambulatory Visit: Payer: Self-pay | Admitting: Nurse Practitioner

## 2021-01-27 ENCOUNTER — Ambulatory Visit: Payer: Self-pay | Admitting: Nurse Practitioner

## 2021-02-02 ENCOUNTER — Ambulatory Visit: Payer: Self-pay | Admitting: Nurse Practitioner

## 2021-02-03 ENCOUNTER — Ambulatory Visit: Payer: Self-pay | Admitting: Nurse Practitioner

## 2021-02-23 NOTE — Progress Notes (Signed)
BP (!) 155/89   Pulse 75   Temp (!) 97.5 F (36.4 C)   Wt 209 lb 6 oz (95 kg)   LMP 08/05/2015 (Approximate)   SpO2 99%   BMI 37.68 kg/m    Subjective:    Patient ID: Felicia Everett, female    DOB: 1966-09-23, 55 y.o.   MRN: [id number]  HPI: Felicia Everett is a 55 y.o. female  Chief Complaint  Patient presents with  . Diabetes   ANXIETY/STRESS Patient states her anxiety is much better controlled. She had some stress in her life prior but she feels like she is doing well now.  Depression screen [facility] 2/9 02/24/2021 04/16/2020  Decreased Interest 0 -  Down, Depressed, Hopeless 0 -  PHQ - 2 Score 0 -  Altered sleeping - 1  Tired, decreased energy - 1  Change in appetite - 1  Feeling bad or failure about yourself  - 1  Trouble concentrating - 1  Moving slowly or fidgety/restless - 0  Suicidal thoughts - 0    HYPERTENSION Hypertension status: uncontrolled  Satisfied with current treatment? no current treatment Duration of hypertension: unknown BP monitoring frequency:  rarely BP range: 135/150/80  BP medication side effects:  no Medication compliance: none Previous BP meds:none Aspirin: no Recurrent headaches: no Visual changes: no Palpitations: no Dyspnea: no Chest pain: no Lower extremity edema: no Dizzy/lightheaded: no  DIABETES Patient states she did not start the medication after her last visit. She has cut out sodas and decreasing the amount of sugar that she is eating.   Relevant past medical, surgical, family and social history reviewed and updated as indicated. Interim medical history since our last visit reviewed. Allergies and medications reviewed and updated.  Review of Systems  Eyes: Negative for visual disturbance.  Cardiovascular: Negative for chest pain.  Endocrine: Negative for polydipsia and polyuria.  Neurological: Negative for numbness.  Psychiatric/Behavioral: The patient is nervous/anxious.     Per HPI unless specifically indicated  above     Objective:    BP (!) 155/89   Pulse 75   Temp (!) 97.5 F (36.4 C)   Wt 209 lb 6 oz (95 kg)   LMP 08/05/2015 (Approximate)   SpO2 99%   BMI 37.68 kg/m   Wt Readings from Last 3 Encounters:  02/24/21 209 lb 6 oz (95 kg)  04/16/20 211 lb (95.7 kg)  01/23/20 214 lb (97.1 kg)    Physical Exam Vitals and nursing note reviewed.  Constitutional:      General: She is not in acute distress.    Appearance: Normal appearance. She is normal weight. She is not ill-appearing, toxic-appearing or diaphoretic.  HENT:     Head: Normocephalic.     Right Ear: External ear normal.     Left Ear: External ear normal.     Nose: Nose normal.     Mouth/Throat:     Mouth: Mucous membranes are moist.     Pharynx: Oropharynx is clear.  Eyes:     General:        Right eye: No discharge.        Left eye: No discharge.     Extraocular Movements: Extraocular movements intact.     Conjunctiva/sclera: Conjunctivae normal.     Pupils: Pupils are equal, round, and reactive to light.  Cardiovascular:     Rate and Rhythm: Normal rate and regular rhythm.     Heart sounds: No murmur heard.   Pulmonary:  Effort: Pulmonary effort is normal. No respiratory distress.     Breath sounds: Normal breath sounds. No wheezing or rales.  Musculoskeletal:     Cervical back: Normal range of motion and neck supple.  Skin:    General: Skin is warm and dry.     Capillary Refill: Capillary refill takes less than 2 seconds.  Neurological:     General: No focal deficit present.     Mental Status: She is alert and oriented to person, place, and time. Mental status is at baseline.  Psychiatric:        Mood and Affect: Mood normal.        Behavior: Behavior normal.        Thought Content: Thought content normal.        Judgment: Judgment normal.     Results for orders placed or performed in visit on 04/16/20  WET PREP FOR TRICH, YEAST, CLUE   Specimen: Vaginal; Sterile Swab   Sterile Swab  Result  Value Ref Range   Trichomonas Exam Negative Negative   Yeast Exam Negative Negative   Clue Cell Exam Positive (A) Negative  Microscopic Examination   Urine  Result Value Ref Range   WBC, UA 6-10 (A) 0 - 5 /hpf   RBC 11-30 (A) 0 - 2 /hpf   Epithelial Cells (non renal) 0-10 0 - 10 /hpf   Bacteria, UA Few (A) None seen/Few  Urine Culture, Reflex   Urine  Result Value Ref Range   Urine Culture, Routine Final report (A)    Organism ID, Bacteria Citrobacter koseri (A)    Antimicrobial Susceptibility Comment   UA/M w/rflx Culture, Routine   Specimen: Urine   Urine  Result Value Ref Range   Specific Gravity, UA <1.005 (L) 1.005 - 1.030   pH, UA 5.5 5.0 - 7.5   Color, UA Yellow Yellow   Appearance Ur Hazy (A) Clear   Leukocytes,UA Trace (A) Negative   Protein,UA Negative Negative/Trace   Glucose, UA 3+ (A) Negative   Ketones, UA Negative Negative   RBC, UA 3+ (A) Negative   Bilirubin, UA Negative Negative   Urobilinogen, Ur 0.2 0.2 - 1.0 mg/dL   Nitrite, UA Negative Negative   Microscopic Examination See below:    Urinalysis Reflex Comment   HgB A1c  Result Value Ref Range   Hgb A1c MFr Bld 11.3 (H) 4.8 - 5.6 %   Est. average glucose Bld gHb Est-mCnc 278 mg/dL  Basic metabolic panel  Result Value Ref Range   Glucose 405 (H) 65 - 99 mg/dL   BUN 7 6 - 24 mg/dL   Creatinine, Ser 7.03 0.57 - 1.00 mg/dL   GFR calc non Af Amer 101 >59 mL/min/1.73   GFR calc Af Amer 117 >59 mL/min/1.73   BUN/Creatinine Ratio 11 9 - 23   Sodium 134 134 - 144 mmol/L   Potassium 3.9 3.5 - 5.2 mmol/L   Chloride 96 96 - 106 mmol/L   CO2 22 20 - 29 mmol/L   Calcium 9.5 8.7 - 10.2 mg/dL      Assessment & Plan:   Problem List Items Addressed This Visit      Cardiovascular and Mediastinum   Hypertension associated with diabetes (HCC) - Primary    Chronic.  Uncontrolled.  Lengthy discussion had about the risks of uncontrolled hypertension.  The recommendation to be on medication to help protect the  kidneys in the setting of diabetes.  Patient would like to see what her  lab work shows today and I will give my recommendations and she will decide if she should begin medication or work on lifestyle modifications.       Relevant Orders   Lipid panel   Basic metabolic panel     Endocrine   Type 2 diabetes mellitus with hyperglycemia (HCC)    Chronic.  Uncontrolled.  Lengthy discussion had about the risks of uncontrolled diabetes, medication recommendations, and lifestyle modifications.  Recommended patient complete diabetes education.   Patient would like to see what her lab work shows today and I will give my recommendations and she will decide if she should begin medication or work on lifestyle modifications.       Relevant Orders   HgB A1c     Other   Morbid obesity (HCC)    Chronic.  Discussed lifestyle modifications with patient during visit today.       Anxiety    Controlled.  Patient states that her anxiety is better controlled and some of the stressful factors have resolved.       Vitamin D deficiency    Labs drawn at visit today.       Relevant Orders   Vitamin D (25 hydroxy)    Other Visit Diagnoses    Screening for colon cancer       Encounter for screening mammogram for malignant neoplasm of breast           Follow up plan: Return in about 3 months (around 05/26/2021) for Physical and Fasting labs and PAP.

## 2021-02-24 ENCOUNTER — Ambulatory Visit (INDEPENDENT_AMBULATORY_CARE_PROVIDER_SITE_OTHER): Payer: Self-pay | Admitting: Nurse Practitioner

## 2021-02-24 ENCOUNTER — Encounter: Payer: Self-pay | Admitting: Nurse Practitioner

## 2021-02-24 ENCOUNTER — Telehealth: Payer: Self-pay

## 2021-02-24 ENCOUNTER — Other Ambulatory Visit: Payer: Self-pay

## 2021-02-24 ENCOUNTER — Ambulatory Visit: Payer: Self-pay | Admitting: Nurse Practitioner

## 2021-02-24 VITALS — BP 155/89 | HR 75 | Temp 97.5°F | Wt 209.4 lb

## 2021-02-24 DIAGNOSIS — I152 Hypertension secondary to endocrine disorders: Secondary | ICD-10-CM

## 2021-02-24 DIAGNOSIS — Z1211 Encounter for screening for malignant neoplasm of colon: Secondary | ICD-10-CM

## 2021-02-24 DIAGNOSIS — E559 Vitamin D deficiency, unspecified: Secondary | ICD-10-CM

## 2021-02-24 DIAGNOSIS — F419 Anxiety disorder, unspecified: Secondary | ICD-10-CM

## 2021-02-24 DIAGNOSIS — E1159 Type 2 diabetes mellitus with other circulatory complications: Secondary | ICD-10-CM

## 2021-02-24 DIAGNOSIS — Z1231 Encounter for screening mammogram for malignant neoplasm of breast: Secondary | ICD-10-CM

## 2021-02-24 DIAGNOSIS — E1165 Type 2 diabetes mellitus with hyperglycemia: Secondary | ICD-10-CM

## 2021-02-24 NOTE — Assessment & Plan Note (Signed)
Chronic.  Uncontrolled.  Lengthy discussion had about the risks of uncontrolled diabetes, medication recommendations, and lifestyle modifications.  Recommended patient complete diabetes education.   Patient would like to see what her lab work shows today and I will give my recommendations and she will decide if she should begin medication or work on lifestyle modifications.

## 2021-02-24 NOTE — Assessment & Plan Note (Signed)
Labs drawn at visit today.

## 2021-02-24 NOTE — Assessment & Plan Note (Signed)
Chronic.  Uncontrolled.  Lengthy discussion had about the risks of uncontrolled hypertension.  The recommendation to be on medication to help protect the kidneys in the setting of diabetes.  Patient would like to see what her lab work shows today and I will give my recommendations and she will decide if she should begin medication or work on lifestyle modifications.

## 2021-02-24 NOTE — Assessment & Plan Note (Signed)
Chronic.  Discussed lifestyle modifications with patient during visit today.

## 2021-02-24 NOTE — Telephone Encounter (Signed)
Cologuard completed and placed in bin for signature.

## 2021-02-24 NOTE — Assessment & Plan Note (Signed)
Controlled.  Patient states that her anxiety is better controlled and some of the stressful factors have resolved.

## 2021-02-24 NOTE — Telephone Encounter (Signed)
-----   Message from Larae Grooms, NP sent at 02/24/2021  9:34 AM EDT ----- Patient would like to do Cologuard.

## 2021-02-25 LAB — BASIC METABOLIC PANEL
BUN/Creatinine Ratio: 22 (ref 9–23)
BUN: 13 mg/dL (ref 6–24)
CO2: 21 mmol/L (ref 20–29)
Calcium: 9.7 mg/dL (ref 8.7–10.2)
Chloride: 99 mmol/L (ref 96–106)
Creatinine, Ser: 0.6 mg/dL (ref 0.57–1.00)
Glucose: 215 mg/dL — ABNORMAL HIGH (ref 65–99)
Potassium: 4.2 mmol/L (ref 3.5–5.2)
Sodium: 138 mmol/L (ref 134–144)
eGFR: 107 mL/min/{1.73_m2} (ref >59)

## 2021-02-25 LAB — HEMOGLOBIN A1C
Est. average glucose Bld gHb Est-mCnc: 203 mg/dL
Hgb A1c MFr Bld: 8.7 % — ABNORMAL HIGH (ref 4.8–5.6)

## 2021-02-25 LAB — LIPID PANEL
Chol/HDL Ratio: 5.6 ratio — ABNORMAL HIGH (ref 0.0–4.4)
Cholesterol, Total: 245 mg/dL — ABNORMAL HIGH (ref 100–199)
HDL: 44 mg/dL (ref >39)
LDL Chol Calc (NIH): 145 mg/dL — ABNORMAL HIGH (ref 0–99)
Triglycerides: 305 mg/dL — ABNORMAL HIGH (ref 0–149)
VLDL Cholesterol Cal: 56 mg/dL — ABNORMAL HIGH (ref 5–40)

## 2021-02-25 LAB — VITAMIN D 25 HYDROXY (VIT D DEFICIENCY, FRACTURES): Vit D, 25-Hydroxy: 17.3 ng/mL — ABNORMAL LOW (ref 30.0–100.0)

## 2021-02-25 NOTE — Progress Notes (Signed)
Hi Kalenna, it was a pleasure meeting you yesterday.  Your lab work shows that your cholesterol is still elevated.  You cardiac risk score does put you in the high category which means you are at risk for a heart attack or stroke.  With that said, I recommend you begin Crestor 5mg  daily.  We will increase this as you are able to tolerate it to 20mg  daily.  This will help treat your cholesterol as well as aid in prevention of heart attack or stroke.   Your vitamin D is still low.  I recommend you take 1000IU of vitamin D over the counter daily.   Your A1c is improved from prior which is great news.  Your efforts to stop drinking soda and reduce your sugar intake had helped a lot.  However, it is still above gaol and I do recommend you take the Metformin 500mg  twice daily to help bring this down to our goal of 6.5.  Like I said yesterday, diet and exercise will increase the ability to bring your A1c down!    I also recommend you start Lisinopril 5mg  daily to help with your blood pressure and help protect your kidneys from the diabetes.    Please let me know what you decide! I will see you at our next visit.

## 2021-03-24 ENCOUNTER — Other Ambulatory Visit: Payer: Self-pay | Admitting: Nurse Practitioner

## 2021-03-24 DIAGNOSIS — Z1231 Encounter for screening mammogram for malignant neoplasm of breast: Secondary | ICD-10-CM

## 2021-04-06 ENCOUNTER — Ambulatory Visit: Payer: No Typology Code available for payment source

## 2021-04-14 ENCOUNTER — Ambulatory Visit
Admission: RE | Admit: 2021-04-14 | Discharge: 2021-04-14 | Disposition: A | Discharge status: Home / Self Care | Payer: Self-pay | Source: Ambulatory Visit | Attending: Nurse Practitioner | Admitting: Nurse Practitioner

## 2021-04-14 ENCOUNTER — Other Ambulatory Visit: Payer: Self-pay

## 2021-04-14 DIAGNOSIS — Z1231 Encounter for screening mammogram for malignant neoplasm of breast: Secondary | ICD-10-CM | POA: Insufficient documentation

## 2021-04-16 NOTE — Progress Notes (Signed)
Please let patient know her Mammogram did not show any evidence of a malignancy.  The recommendation is to repeat the Mammogram in 1 year.  

## 2021-04-22 ENCOUNTER — Telehealth: Payer: Self-pay | Admitting: *Deleted

## 2021-04-22 NOTE — Telephone Encounter (Signed)
Patient states [employer] was supposed to cover her Mammogram on 6/14 but she received a bill.  Routing to team for follow up.

## 2021-05-26 ENCOUNTER — Encounter: Payer: Self-pay | Admitting: Nurse Practitioner

## 2021-06-02 ENCOUNTER — Encounter: Payer: Self-pay | Admitting: Nurse Practitioner

## 2021-07-14 NOTE — Progress Notes (Signed)
BP 134/78   Pulse 75   Temp 98.2 F (36.8 C) (Oral)   Ht 5' 3.4" (1.61 m)   Wt 200 lb 12.8 oz (91.1 kg)   LMP 08/05/2015 (Approximate)   SpO2 97%   BMI 35.12 kg/m    Subjective:    Patient ID: Felicia Everett, female    DOB: May 15, 1966, 55 y.o.   MRN: [id number]  HPI: Felicia Everett is a 55 y.o. female presenting on 07/15/2021 for comprehensive medical examination. Current medical complaints include:none  She currently lives with: Menopausal Symptoms: no  HYPERTENSION Hypertension status: controlled  Satisfied with current treatment?  Not currently on medication Duration of hypertension: years BP monitoring frequency:  daily BP range: 120/80 BP medication side effects:  no Medication compliance:  not currently on medication Previous BP meds:none Aspirin: no Recurrent headaches: no Visual changes: no Palpitations: no Dyspnea: no Chest pain: no Lower extremity edema: no Dizzy/lightheaded: no  DIABETES Hypoglycemic episodes:no Polydipsia/polyuria: no Visual disturbance: no Chest pain: no Paresthesias: yes in feet Glucose Monitoring: no  Accucheck frequency: Not Checking  Fasting glucose:  Post prandial:  Evening:  Before meals: Taking Insulin?: no  Long acting insulin:  Short acting insulin: Blood Pressure Monitoring: weekly Retinal Examination: Not up to Date Foot Exam: Up to Date Diabetic Education: Not Completed Pneumovax: Not up to Date Influenza: Not up to Date Aspirin: yes  ANXIETY Patient states she feels like her anxiety is better.  Her grandson is now in preschool and less stressful for her.  Denies concerns at visit today. Denies SI.  Depression Screen done today and results listed below:  Depression screen [facility] 2/9 07/15/2021 02/24/2021 04/16/2020  Decreased Interest 0 0 -  Down, Depressed, Hopeless 0 0 -  PHQ - 2 Score 0 0 -  Altered sleeping - - 1  Tired, decreased energy - - 1  Change in appetite - - 1  Feeling bad or failure about  yourself  - - 1  Trouble concentrating - - 1  Moving slowly or fidgety/restless - - 0  Suicidal thoughts - - 0    The patient does not have a history of falls. I did complete a risk assessment for falls. A plan of care for falls was documented.   Past Medical History:  Past Medical History:  Diagnosis Date   Allergy    Anxiety    Arthritis    Hypertension    Obesity    Ovarian cyst 09/2015   right side    Surgical History:  Past Surgical History:  Procedure Laterality Date   CESAREAN SECTION     OOPHORECTOMY     TUBAL LIGATION      Medications:  Current Outpatient Medications on File Prior to Visit  Medication Sig   IBUPROFEN PO Take by mouth as needed.   LORATADINE ALLERGY RELIEF PO Take by mouth as needed.   metFORMIN (GLUCOPHAGE) 500 MG tablet Take 1 tablet (500 mg total) by mouth 2 (two) times daily with a meal.   No current facility-administered medications on file prior to visit.    Allergies:  Allergies  Allergen Reactions   Ibuprofen Hives    She can take the gel caps   Red Dye Hives   Latex Rash    Social History:  Social History   Socioeconomic History   Marital status: Married    Spouse name: Not on file   Number of children: Not on file   Years of education: Not on file  Highest education level: Not on file  Occupational History   Not on file  Tobacco Use   Smoking status: Never   Smokeless tobacco: Never  Vaping Use   Vaping Use: Never used  Substance and Sexual Activity   Alcohol use: No   Drug use: No   Sexual activity: Yes  Other Topics Concern   Not on file  Social History Narrative   Not on file   Social Determinants of Health   Financial Resource Strain: Not on file  Food Insecurity: Not on file  Transportation Needs: Not on file  Physical Activity: Not on file  Stress: Not on file  Social Connections: Not on file  Intimate Partner Violence: Not on file   Social History   Tobacco Use  Smoking Status Never   Smokeless Tobacco Never   Social History   Substance and Sexual Activity  Alcohol Use No    Family History:  Family History  Problem Relation Age of Onset   COPD Mother    Heart disease Father        possible heart attack   Alcohol abuse Father    Drug abuse Daughter    Cancer Maternal Grandmother    Diabetes Neg Hx    Hypertension Neg Hx    Stroke Neg Hx    Breast cancer Neg Hx     Past medical history, surgical history, medications, allergies, family history and social history reviewed with patient today and changes made to appropriate areas of the chart.   Review of Systems  Eyes:  Negative for blurred vision and double vision.  Respiratory:  Negative for shortness of breath.   Cardiovascular:  Negative for chest pain, palpitations and leg swelling.  Neurological:  Negative for dizziness and headaches.  All other ROS negative except what is listed above and in the HPI.      Objective:    BP 134/78   Pulse 75   Temp 98.2 F (36.8 C) (Oral)   Ht 5' 3.4" (1.61 m)   Wt 200 lb 12.8 oz (91.1 kg)   LMP 08/05/2015 (Approximate)   SpO2 97%   BMI 35.12 kg/m   Wt Readings from Last 3 Encounters:  07/15/21 200 lb 12.8 oz (91.1 kg)  02/24/21 209 lb 6 oz (95 kg)  04/16/20 211 lb (95.7 kg)    Physical Exam Vitals and nursing note reviewed. Exam conducted with a chaperone present Yvonna Alanis, [place]).  Constitutional:      General: She is awake. She is not in acute distress.    Appearance: She is well-developed. She is obese. She is not ill-appearing.  HENT:     Head: Normocephalic and atraumatic.     Right Ear: Hearing, tympanic membrane, ear canal and external ear normal. No drainage.     Left Ear: Hearing, tympanic membrane, ear canal and external ear normal. No drainage.     Nose: Nose normal.     Right Sinus: No maxillary sinus tenderness or frontal sinus tenderness.     Left Sinus: No maxillary sinus tenderness or frontal sinus tenderness.     Mouth/Throat:      Mouth: Mucous membranes are moist.     Pharynx: Oropharynx is clear. Uvula midline. No pharyngeal swelling, oropharyngeal exudate or posterior oropharyngeal erythema.  Eyes:     General: Lids are normal.        Right eye: No discharge.        Left eye: No discharge.     Extraocular  Movements: Extraocular movements intact.     Conjunctiva/sclera: Conjunctivae normal.     Pupils: Pupils are equal, round, and reactive to light.     Visual Fields: Right eye visual fields normal and left eye visual fields normal.  Neck:     Thyroid: No thyromegaly.     Vascular: No carotid bruit.     Trachea: Trachea normal.  Cardiovascular:     Rate and Rhythm: Normal rate and regular rhythm.     Heart sounds: Normal heart sounds. No murmur heard.   No gallop.  Pulmonary:     Effort: Pulmonary effort is normal. No accessory muscle usage or respiratory distress.     Breath sounds: Normal breath sounds.  Chest:  Breasts:    Right: Normal.     Left: Normal.  Abdominal:     General: Bowel sounds are normal.     Palpations: Abdomen is soft. There is no hepatomegaly or splenomegaly.     Tenderness: There is no abdominal tenderness.  Genitourinary:    General: Normal vulva.     Vagina: Vaginal discharge (thick white discharge) present.     Cervix: Normal.     Adnexa: Right adnexa normal and left adnexa normal.  Musculoskeletal:        General: Normal range of motion.     Cervical back: Normal range of motion and neck supple.     Right lower leg: No edema.     Left lower leg: No edema.  Lymphadenopathy:     Head:     Right side of head: No submental, submandibular, tonsillar, preauricular or posterior auricular adenopathy.     Left side of head: No submental, submandibular, tonsillar, preauricular or posterior auricular adenopathy.     Cervical: No cervical adenopathy.     Upper Body:     Right upper body: No supraclavicular, axillary or pectoral adenopathy.     Left upper body: No  supraclavicular, axillary or pectoral adenopathy.  Skin:    General: Skin is warm and dry.     Capillary Refill: Capillary refill takes less than 2 seconds.     Findings: No rash.  Neurological:     Mental Status: She is alert and oriented to person, place, and time.     Cranial Nerves: Cranial nerves are intact.     Gait: Gait is intact.     Deep Tendon Reflexes: Reflexes are normal and symmetric.     Reflex Scores:      Brachioradialis reflexes are 2+ on the right side and 2+ on the left side.      Patellar reflexes are 2+ on the right side and 2+ on the left side. Psychiatric:        Attention and Perception: Attention normal.        Mood and Affect: Mood normal.        Speech: Speech normal.        Behavior: Behavior normal. Behavior is cooperative.        Thought Content: Thought content normal.        Judgment: Judgment normal.    Results for orders placed or performed in visit on 02/24/21  Lipid panel  Result Value Ref Range   Cholesterol, Total 245 (H) 100 - 199 mg/dL   Triglycerides 305 (H) 0 - 149 mg/dL   HDL 44 >39 mg/dL   VLDL Cholesterol Cal 56 (H) 5 - 40 mg/dL   LDL Chol Calc (NIH) 145 (H) 0 - 99 mg/dL  Chol/HDL Ratio 5.6 (H) 0.0 - 4.4 ratio  Basic metabolic panel  Result Value Ref Range   Glucose 215 (H) 65 - 99 mg/dL   BUN 13 6 - 24 mg/dL   Creatinine, Ser 0.60 0.57 - 1.00 mg/dL   eGFR 107 >59 mL/min/1.73   BUN/Creatinine Ratio 22 9 - 23   Sodium 138 134 - 144 mmol/L   Potassium 4.2 3.5 - 5.2 mmol/L   Chloride 99 96 - 106 mmol/L   CO2 21 20 - 29 mmol/L   Calcium 9.7 8.7 - 10.2 mg/dL  Vitamin D (25 hydroxy)  Result Value Ref Range   Vit D, 25-Hydroxy 17.3 (L) 30.0 - 100.0 ng/mL  HgB A1c  Result Value Ref Range   Hgb A1c MFr Bld 8.7 (H) 4.8 - 5.6 %   Est. average glucose Bld gHb Est-mCnc 203 mg/dL      Assessment & Plan:   Problem List Items Addressed This Visit       Cardiovascular and Mediastinum   Hypertension associated with diabetes (HCC)     Chronic.  Controlled.  Continue with current medication regimen.  Labs ordered today.  Return to clinic in 6 months for reevaluation.  Call sooner if concerns arise.          Endocrine   Type 2 diabetes mellitus with hyperglycemia (HCC)    Chronic.  Controlled.  Continue with current medication regimen.  Labs ordered today.  Return to clinic in 6 months for reevaluation.  Call sooner if concerns arise.        Relevant Orders   HgB A1c   Microalbumin, Urine Waived     Other   Morbid obesity ([place])    Recommend a healthy lifestyle through diet and exercise.       Anxiety    Chronic.  Controlled.  Continue with current medication regimen.  Labs ordered today.  Return to clinic in 6 months for reevaluation.  Call sooner if concerns arise.        Vitamin D deficiency    Labs ordered today.  Will make recommendations based on lab results.       Relevant Orders   Vitamin D (25 hydroxy)   Other Visit Diagnoses     Annual physical exam    -  Primary   Health maintenance reviewed today. PAP obtained during visit. Labs ordered today.  Refused vaccines.    Relevant Orders   CBC with Differential/Platelet   Comprehensive metabolic panel   Lipid panel   TSH   Urinalysis, Routine w reflex microscopic   HgB A1c   Screening for cervical cancer       PAP obtained in normal fashion.  Patient tolerated obtaining of specimen without complication.  Escorted by Yvonna Alanis, [place].   Relevant Orders   Cytology - PAP        Follow up plan: Return in about 6 months (around 01/12/2022) for HTN, HLD, DM2 FU.   LABORATORY TESTING:  - Pap smear: pap done  IMMUNIZATIONS:   - Tdap: Tetanus vaccination status reviewed: Refused. - Influenza:  Refused - Pneumovax: Refused - Prevnar: Not applicable - HPV: Not applicable - Zostavax vaccine: Refused  SCREENING: -Mammogram: Up to date  - Colonoscopy: Refused  - Bone Density: Not applicable  -Hearing Test: Not applicable   -Spirometry: Not applicable   PATIENT COUNSELING:   Advised to take 1 mg of folate supplement per day if capable of pregnancy.   Sexuality: Discussed sexually transmitted diseases, partner  selection, use of condoms, avoidance of unintended pregnancy  and contraceptive alternatives.   Advised to avoid cigarette smoking.  I discussed with the patient that most people either abstain from alcohol or drink within safe limits (<=14/week and <=4 drinks/occasion for males, <=7/weeks and <= 3 drinks/occasion for females) and that the risk for alcohol disorders and other health effects rises proportionally with the number of drinks per week and how often a drinker exceeds daily limits.  Discussed cessation/primary prevention of drug use and availability of treatment for abuse.   Diet: Encouraged to adjust caloric intake to maintain  or achieve ideal body weight, to reduce intake of dietary saturated fat and total fat, to limit sodium intake by avoiding high sodium foods and not adding table salt, and to maintain adequate dietary potassium and calcium preferably from fresh fruits, vegetables, and low-fat dairy products.    stressed the importance of regular exercise  Injury prevention: Discussed safety belts, safety helmets, smoke detector, smoking near bedding or upholstery.   Dental health: Discussed importance of regular tooth brushing, flossing, and dental visits.    NEXT PREVENTATIVE PHYSICAL DUE IN 1 YEAR. Return in about 6 months (around 01/12/2022) for HTN, HLD, DM2 FU.

## 2021-07-15 ENCOUNTER — Other Ambulatory Visit: Payer: Self-pay | Admitting: Nurse Practitioner

## 2021-07-15 ENCOUNTER — Other Ambulatory Visit: Payer: Self-pay

## 2021-07-15 ENCOUNTER — Other Ambulatory Visit (HOSPITAL_COMMUNITY)
Admission: RE | Admit: 2021-07-15 | Discharge: 2021-07-15 | Disposition: A | Discharge status: Home / Self Care | Payer: No Typology Code available for payment source | Source: Ambulatory Visit | Attending: Nurse Practitioner | Admitting: Nurse Practitioner

## 2021-07-15 ENCOUNTER — Encounter: Payer: Self-pay | Admitting: Nurse Practitioner

## 2021-07-15 ENCOUNTER — Ambulatory Visit (INDEPENDENT_AMBULATORY_CARE_PROVIDER_SITE_OTHER): Payer: Self-pay | Admitting: Nurse Practitioner

## 2021-07-15 VITALS — BP 134/78 | HR 75 | Temp 98.2°F | Ht 63.4 in | Wt 200.8 lb

## 2021-07-15 DIAGNOSIS — Z124 Encounter for screening for malignant neoplasm of cervix: Secondary | ICD-10-CM

## 2021-07-15 DIAGNOSIS — E1159 Type 2 diabetes mellitus with other circulatory complications: Secondary | ICD-10-CM

## 2021-07-15 DIAGNOSIS — E559 Vitamin D deficiency, unspecified: Secondary | ICD-10-CM

## 2021-07-15 DIAGNOSIS — I152 Hypertension secondary to endocrine disorders: Secondary | ICD-10-CM

## 2021-07-15 DIAGNOSIS — E1165 Type 2 diabetes mellitus with hyperglycemia: Secondary | ICD-10-CM

## 2021-07-15 DIAGNOSIS — R8290 Unspecified abnormal findings in urine: Secondary | ICD-10-CM

## 2021-07-15 DIAGNOSIS — F419 Anxiety disorder, unspecified: Secondary | ICD-10-CM

## 2021-07-15 DIAGNOSIS — Z0000 Encounter for general adult medical examination without abnormal findings: Secondary | ICD-10-CM

## 2021-07-15 LAB — URINALYSIS, ROUTINE W REFLEX MICROSCOPIC
Bilirubin, UA: NEGATIVE
Glucose, UA: NEGATIVE
Ketones, UA: NEGATIVE
Nitrite, UA: POSITIVE — AB
Protein,UA: NEGATIVE
Specific Gravity, UA: 1.02 (ref 1.005–1.030)
Urobilinogen, Ur: 0.2 mg/dL (ref 0.2–1.0)
pH, UA: 5 (ref 5.0–7.5)

## 2021-07-15 LAB — MICROALBUMIN, URINE WAIVED
Creatinine, Urine Waived: 300 mg/dL (ref 10–300)
Microalb, Ur Waived: 80 mg/L — ABNORMAL HIGH (ref 0–19)

## 2021-07-15 LAB — MICROSCOPIC EXAMINATION

## 2021-07-15 NOTE — Assessment & Plan Note (Signed)
Chronic.  Controlled.  Continue with current medication regimen.  Labs ordered today.  Return to clinic in 6 months for reevaluation.  Call sooner if concerns arise.  ? ?

## 2021-07-15 NOTE — Telephone Encounter (Signed)
Medication Refill - Medication:  metFORMIN (GLUCOPHAGE) 500 MG tablet 60 tablet 2 04/18/2020    Sig - Route: Take 1 tablet (500 mg total) by mouth 2 (two) times daily with a meal. - Oral   Sent to pharmacy as: metFORMIN (GLUCOPHAGE) 500 MG tablet   E-Prescribing Status: Receipt confirmed by pharmacy (04/18/2020 12:32 PM EDT)     Has the patient contacted their pharmacy? No. (Agent: If no, request that the patient contact the pharmacy for the refill.)Pt said at appt today she needed a new refill (Agent: If yes, when and what did the pharmacy advise?)  Preferred Pharmacy (with phone number or street name):  [facility] [address] [place] - [address]  [address] [place] [address]  Phone: [phone] Fax: [phone]  Hours: Not open 24 hours    Agent: Please be advised that RX refills may take up to 3 business days. We ask that you follow-up with your pharmacy.

## 2021-07-15 NOTE — Assessment & Plan Note (Signed)
Recommend a healthy lifestyle through diet and exercise.  °

## 2021-07-15 NOTE — Addendum Note (Signed)
Addended by: Larae Grooms on: 07/15/2021 10:59 AM   Modules accepted: Orders

## 2021-07-15 NOTE — Progress Notes (Signed)
Hi Felicia Everett.  Your urine sample from this morning was abnormal.  I am sending it out for culture to make sure there is no evidence of a UTI.  I will keep you updated on the results.  Please let me know if you have any questions.

## 2021-07-15 NOTE — Assessment & Plan Note (Signed)
Labs ordered today.  Will make recommendations based on lab results. ?

## 2021-07-16 LAB — CBC WITH DIFFERENTIAL/PLATELET
Basophils Absolute: 0.1 10*3/uL (ref 0.0–0.2)
Basos: 1 %
EOS (ABSOLUTE): 0.3 10*3/uL (ref 0.0–0.4)
Eos: 2 %
Hematocrit: 40.1 % (ref 34.0–46.6)
Hemoglobin: 13.4 g/dL (ref 11.1–15.9)
Immature Grans (Abs): 0 10*3/uL (ref 0.0–0.1)
Immature Granulocytes: 0 %
Lymphocytes Absolute: 2.8 10*3/uL (ref 0.7–3.1)
Lymphs: 26 %
MCH: 29.3 pg (ref 26.6–33.0)
MCHC: 33.4 g/dL (ref 31.5–35.7)
MCV: 88 fL (ref 79–97)
Monocytes Absolute: 0.7 10*3/uL (ref 0.1–0.9)
Monocytes: 6 %
Neutrophils Absolute: 6.9 10*3/uL (ref 1.4–7.0)
Neutrophils: 65 %
Platelets: 363 10*3/uL (ref 150–450)
RBC: 4.58 x10E6/uL (ref 3.77–5.28)
RDW: 13.2 % (ref 11.7–15.4)
WBC: 10.8 10*3/uL (ref 3.4–10.8)

## 2021-07-16 LAB — COMPREHENSIVE METABOLIC PANEL
ALT: 26 IU/L (ref 0–32)
AST: 23 IU/L (ref 0–40)
Albumin/Globulin Ratio: 1.4 (ref 1.2–2.2)
Albumin: 4.8 g/dL (ref 3.8–4.9)
Alkaline Phosphatase: 90 IU/L (ref 44–121)
BUN/Creatinine Ratio: 20 (ref 9–23)
BUN: 12 mg/dL (ref 6–24)
Bilirubin Total: 0.5 mg/dL (ref 0.0–1.2)
CO2: 25 mmol/L (ref 20–29)
Calcium: 9.8 mg/dL (ref 8.7–10.2)
Chloride: 97 mmol/L (ref 96–106)
Creatinine, Ser: 0.6 mg/dL (ref 0.57–1.00)
Globulin, Total: 3.4 g/dL (ref 1.5–4.5)
Glucose: 140 mg/dL — ABNORMAL HIGH (ref 65–99)
Potassium: 4.3 mmol/L (ref 3.5–5.2)
Sodium: 139 mmol/L (ref 134–144)
Total Protein: 8.2 g/dL (ref 6.0–8.5)
eGFR: 107 mL/min/{1.73_m2} (ref >59)

## 2021-07-16 LAB — LIPID PANEL
Chol/HDL Ratio: 4.8 ratio — ABNORMAL HIGH (ref 0.0–4.4)
Cholesterol, Total: 216 mg/dL — ABNORMAL HIGH (ref 100–199)
HDL: 45 mg/dL (ref >39)
LDL Chol Calc (NIH): 131 mg/dL — ABNORMAL HIGH (ref 0–99)
Triglycerides: 226 mg/dL — ABNORMAL HIGH (ref 0–149)
VLDL Cholesterol Cal: 40 mg/dL (ref 5–40)

## 2021-07-16 LAB — HEMOGLOBIN A1C
Est. average glucose Bld gHb Est-mCnc: 151 mg/dL
Hgb A1c MFr Bld: 6.9 % — ABNORMAL HIGH (ref 4.8–5.6)

## 2021-07-16 LAB — TSH: TSH: 2.51 u[IU]/mL (ref 0.450–4.500)

## 2021-07-16 LAB — VITAMIN D 25 HYDROXY (VIT D DEFICIENCY, FRACTURES): Vit D, 25-Hydroxy: 20.9 ng/mL — ABNORMAL LOW (ref 30.0–100.0)

## 2021-07-16 MED ORDER — METFORMIN HCL 500 MG PO TABS: 500.0000 mg | ORAL_TABLET | Freq: Two times a day (BID) | ORAL | 2 refills | Status: DC

## 2021-07-16 NOTE — Progress Notes (Signed)
Please let patient know that her lab work shows that her cholesterol is elevated.  It is improved from prior but still elevated.  I calculated her ASCVD risk score which she can see below.  She is at increased risk of having a stroke or heart attack over the next 10 years.  I recommend she start Crestor 5mg  daily with the goal of increasing it to 20mg  daily as long as she tolerates it.  If she agrees I can send this to the pharmacy. The 10-year ASCVD risk score (Arnett DK, et al., 2019) is: 5%   Values used to calculate the score:     Age: 54 years     Sex: Female     Is Non-Hispanic African American: No     Diabetic: Yes     Tobacco smoker: No     Systolic Blood Pressure: 134 mmHg     Is BP treated: No     HDL Cholesterol: 45 mg/dL     Total Cholesterol: 216 mg/dL  Patient's vitamin D is improved but still low. I recommend she take Vitamin D 1000 IU over the counter to help with this.    Patient's A1c improved from 8.7 to 6.9 which is great news.  Her hard work is paying off!  Keep up the good work.    Other lab work looks good. We will continue to monitor her blood work in the future.  I will send her another message once the PAP and urine culture results come back.

## 2021-07-20 LAB — CYTOLOGY - PAP: Diagnosis: NEGATIVE

## 2021-07-20 MED ORDER — ROSUVASTATIN CALCIUM 5 MG PO TABS: 5.0000 mg | ORAL_TABLET | Freq: Every day | ORAL | 1 refills | Status: DC

## 2021-07-20 MED ORDER — SULFAMETHOXAZOLE-TRIMETHOPRIM 800-160 MG PO TABS: 1.0000 | ORAL_TABLET | Freq: Two times a day (BID) | ORAL | 0 refills | Status: DC

## 2021-07-20 NOTE — Progress Notes (Signed)
Medication sent to the pharmacy.

## 2021-07-20 NOTE — Progress Notes (Signed)
Hi Felicia Everett. Your PAP is normal.  We will repeat it in 5 years.

## 2021-07-20 NOTE — Progress Notes (Signed)
Please let patient know her urine did grow bacteria.  I have sent in Bactrim to treat this.  I will let her know if this needs to be changed to a different medication.

## 2021-07-20 NOTE — Addendum Note (Signed)
Addended by: Larae Grooms on: 07/20/2021 01:05 PM   Modules accepted: Orders

## 2021-07-20 NOTE — Progress Notes (Signed)
Yes I am aware of patient not having insurance. Crestor should be about $8 for a 30 day supply at [facility]. That is why I chose that medication.

## 2021-07-20 NOTE — Addendum Note (Signed)
Addended by: Larae Grooms on: 07/20/2021 10:22 AM   Modules accepted: Orders

## 2021-07-21 LAB — URINE CULTURE

## 2021-07-21 NOTE — Progress Notes (Signed)
Please let patient know that the macrobid she was put on for the UTI was the appropriate antibiotic.  If she starts having symptoms of a UTI and not feeling well please have her let me know.  Please find out if she was able to get the price for the Crestor figured out.  If not, can we call the pharmacy and find out why it was so expensive?

## 2021-11-30 ENCOUNTER — Other Ambulatory Visit: Payer: Self-pay | Admitting: Nurse Practitioner

## 2021-11-30 NOTE — Telephone Encounter (Signed)
Requested Prescriptions  Pending Prescriptions Disp Refills   metFORMIN (GLUCOPHAGE) 500 MG tablet [Pharmacy Med Name: metFORMIN HCl 500 MG Oral Tablet] 60 tablet 1    Sig: TAKE 1 TABLET BY MOUTH TWICE DAILY WITH A MEAL     Endocrinology:  Diabetes - Biguanides Passed - 11/30/2021  1:05 PM      Passed - Cr in normal range and within 360 days    Creatinine, Ser  Date Value Ref Range Status  07/15/2021 0.60 0.57 - 1.00 mg/dL Final         Passed - HBA1C is between 0 and 7.9 and within 180 days    Hgb A1c MFr Bld  Date Value Ref Range Status  07/15/2021 6.9 (H) 4.8 - 5.6 % Final    Comment:             Prediabetes: 5.7 - 6.4          Diabetes: >6.4          Glycemic control for adults with diabetes: <7.0          Passed - eGFR in normal range and within 360 days    GFR calc Af Amer  Date Value Ref Range Status  04/16/2020 117 >59 mL/min/1.73 Final    Comment:    **[facility] currently reports eGFR in compliance with the current**   recommendations of the [employer]. [facility] will   update reporting as new guidelines are published from the NKF-ASN   Task force.    GFR calc non Af Amer  Date Value Ref Range Status  04/16/2020 101 >59 mL/min/1.73 Final   eGFR  Date Value Ref Range Status  07/15/2021 107 >59 mL/min/1.73 Final         Passed - Valid encounter within last 6 months    Recent Outpatient Visits          4 months ago Annual physical exam   [place], [place]   9 months ago Hypertension associated with diabetes [facility])   [facility] Jon Billings, NP   1 year ago Frequent urination   [facility] Merrie Roof [place], [place]   1 year ago [place], [place], [place]   5 years ago Allergic contact dermatitis due to latex   [facility] [place], [place]

## 2022-03-24 ENCOUNTER — Other Ambulatory Visit: Payer: Self-pay | Admitting: Nurse Practitioner

## 2022-03-25 ENCOUNTER — Encounter: Payer: Self-pay | Admitting: Nurse Practitioner

## 2022-03-25 ENCOUNTER — Ambulatory Visit: Payer: Self-pay | Admitting: Nurse Practitioner

## 2022-03-25 VITALS — BP 147/90 | HR 91 | Temp 98.6°F | Wt 206.0 lb

## 2022-03-25 DIAGNOSIS — E7800 Pure hypercholesterolemia, unspecified: Secondary | ICD-10-CM | POA: Insufficient documentation

## 2022-03-25 DIAGNOSIS — E1159 Type 2 diabetes mellitus with other circulatory complications: Secondary | ICD-10-CM

## 2022-03-25 DIAGNOSIS — E1165 Type 2 diabetes mellitus with hyperglycemia: Secondary | ICD-10-CM

## 2022-03-25 DIAGNOSIS — E559 Vitamin D deficiency, unspecified: Secondary | ICD-10-CM

## 2022-03-25 DIAGNOSIS — I152 Hypertension secondary to endocrine disorders: Secondary | ICD-10-CM

## 2022-03-25 MED ORDER — LISINOPRIL 10 MG PO TABS: 10.0000 mg | ORAL_TABLET | Freq: Every day | ORAL | 1 refills | Status: DC

## 2022-03-25 MED ORDER — ROSUVASTATIN CALCIUM 5 MG PO TABS: 5.0000 mg | ORAL_TABLET | Freq: Every day | ORAL | 1 refills | Status: DC

## 2022-03-25 NOTE — Telephone Encounter (Signed)
Requested medication (s) are due for refill today: yes  Requested medication (s) are on the active medication list: yes  Last refill:  11/30/21 #60 1 refills  Future visit scheduled: yes  seen today and in 1 month  Notes to clinic:  protocol failed last labs 07/15/21 . Do you want to refill Rx?     Requested Prescriptions  Pending Prescriptions Disp Refills   metFORMIN (GLUCOPHAGE) 500 MG tablet [Pharmacy Med Name: metFORMIN HCl 500 MG Oral Tablet] 60 tablet 0    Sig: TAKE 1 TABLET BY MOUTH TWICE DAILY WITH A MEAL . APPOINTMENT REQUIRED FOR FUTURE REFILLS IN  MARCH     Endocrinology:  Diabetes - Biguanides Failed - 03/24/2022  1:39 PM      Failed - HBA1C is between 0 and 7.9 and within 180 days    Hgb A1c MFr Bld  Date Value Ref Range Status  07/15/2021 6.9 (H) 4.8 - 5.6 % Final    Comment:             Prediabetes: 5.7 - 6.4          Diabetes: >6.4          Glycemic control for adults with diabetes: <7.0          Failed - B12 Level in normal range and within 720 days    Vitamin B-12  Date Value Ref Range Status  06/03/2016 550 211 - 946 pg/mL Final         Failed - Valid encounter within last 6 months    Recent Outpatient Visits           Today Hypertension associated with diabetes ([place])   [facility] Jon Billings, NP   8 months ago Annual physical exam   [facility] Jon Billings, NP   1 year ago Hypertension associated with diabetes ([place])   [facility] Jon Billings, NP   1 year ago Frequent urination   [facility] Merrie Roof [place], [place]   2 years ago [place], [place], [place]       Future Appointments             In 1 month Jon Billings, NP [employer]             Passed - Cr in normal range and within 360 days    Creatinine, Ser  Date Value Ref Range Status  07/15/2021 0.60 0.57 - 1.00 mg/dL Final         Passed -  eGFR in normal range and within 360 days    GFR calc Af Amer  Date Value Ref Range Status  04/16/2020 117 >59 mL/min/1.73 Final    Comment:    **[facility] currently reports eGFR in compliance with the current**   recommendations of the [employer]. [facility] will   update reporting as new guidelines are published from the NKF-ASN   Task force.    GFR calc non Af Amer  Date Value Ref Range Status  04/16/2020 101 >59 mL/min/1.73 Final   eGFR  Date Value Ref Range Status  07/15/2021 107 >59 mL/min/1.73 Final         Passed - CBC within normal limits and completed in the last 12 months    WBC  Date Value Ref Range Status  07/15/2021 10.8 3.4 - 10.8 x10E3/uL Final   RBC  Date Value Ref Range Status  07/15/2021 4.58 3.77 -  5.28 x10E6/uL Final   Hemoglobin  Date Value Ref Range Status  07/15/2021 13.4 11.1 - 15.9 g/dL Final   Hematocrit  Date Value Ref Range Status  07/15/2021 40.1 34.0 - 46.6 % Final   MCHC  Date Value Ref Range Status  07/15/2021 33.4 31.5 - 35.7 g/dL Final   [facility]  Date Value Ref Range Status  07/15/2021 29.3 26.6 - 33.0 pg Final   MCV  Date Value Ref Range Status  07/15/2021 88 79 - 97 fL Final   No results found for: PLTCOUNTKUC, LABPLAT, POCPLA RDW  Date Value Ref Range Status  07/15/2021 13.2 11.7 - 15.4 % Final

## 2022-03-25 NOTE — Assessment & Plan Note (Signed)
Chronic. Not well controlled.  Lengthy discussion had with patient today regarding benefits of getting blood pressure under control as well as taking Ace/ARB in the setting of diabetes.  Labs ordered today.  Will start Lisinopril 10mg  daily.  Side effects and benefits of medication discussed during visit. Follow up in 1 month.

## 2022-03-25 NOTE — Assessment & Plan Note (Signed)
Labs ordered today.  Will make recommendations based on lab results. ?

## 2022-03-25 NOTE — Assessment & Plan Note (Signed)
Chronic.  Not well controlled.  Lengthy discussion had with patient regarding benefits of statin in the setting of type 2 diabetes.  Agrees to start Crestor 5mg .  Side effects and benefits discussed during visit.  Labs ordered today.

## 2022-03-25 NOTE — Progress Notes (Signed)
BP (!) 147/90   Pulse 91   Temp 98.6 F (37 C) (Oral)   Wt 206 lb (93.4 kg)   LMP 08/05/2015 (Approximate)   SpO2 96%   BMI 36.03 kg/m    Subjective:    Patient ID: Felicia Everett, female    DOB: 1966-03-30, 56 y.o.   MRN: [id number]  HPI: Felicia Everett is a 56 y.o. female  Chief Complaint  Patient presents with   Medication Refill    Needs refill on metformin. Pt states she would like a referral so she can get her mammogram scheduled.    Diabetic Eye Exam    Records from last eye exam requested.     HYPERTENSION / HYPERLIPIDEMIA Satisfied with current treatment? yes Duration of hypertension: years BP monitoring frequency: not checking BP range:  BP medication side effects: no Past BP meds: none Duration of hyperlipidemia: years Cholesterol medication side effects: no Cholesterol supplements: none Past cholesterol medications: none Medication compliance: poor compliance Aspirin: no Recent stressors: no Recurrent headaches: no Visual changes: no Palpitations: no Dyspnea: no Chest pain: no Lower extremity edema: no Dizzy/lightheaded: no  DIABETES Hypoglycemic episodes:no Polydipsia/polyuria: no Visual disturbance: no Chest pain: no Paresthesias: no Glucose Monitoring: no  Accucheck frequency: Not Checking  Fasting glucose:  Post prandial:  Evening:  Before meals: Taking Insulin?: no  Long acting insulin:  Short acting insulin: Blood Pressure Monitoring: not checking   The 10-year ASCVD risk score (Arnett DK, et al., 2019) is: 8.7%   Values used to calculate the score:     Age: 32 years     Sex: Female     Is Non-Hispanic African American: No     Diabetic: Yes     Tobacco smoker: No     Systolic Blood Pressure: 559 mmHg     Is BP treated: Yes     HDL Cholesterol: 45 mg/dL     Total Cholesterol: 216 mg/dL   Relevant past medical, surgical, family and social history reviewed and updated as indicated. Interim medical history since our last  visit reviewed. Allergies and medications reviewed and updated.  Review of Systems  Eyes:  Negative for visual disturbance.  Respiratory:  Negative for cough, chest tightness and shortness of breath.   Cardiovascular:  Negative for chest pain, palpitations and leg swelling.  Endocrine: Negative for polydipsia and polyuria.  Neurological:  Negative for dizziness, numbness and headaches.   Per HPI unless specifically indicated above     Objective:    BP (!) 147/90   Pulse 91   Temp 98.6 F (37 C) (Oral)   Wt 206 lb (93.4 kg)   LMP 08/05/2015 (Approximate)   SpO2 96%   BMI 36.03 kg/m   Wt Readings from Last 3 Encounters:  03/25/22 206 lb (93.4 kg)  07/15/21 200 lb 12.8 oz (91.1 kg)  02/24/21 209 lb 6 oz (95 kg)    Physical Exam Vitals and nursing note reviewed.  Constitutional:      General: She is not in acute distress.    Appearance: Normal appearance. She is obese. She is not ill-appearing, toxic-appearing or diaphoretic.  HENT:     Head: Normocephalic.     Right Ear: External ear normal.     Left Ear: External ear normal.     Nose: Nose normal.     Mouth/Throat:     Mouth: Mucous membranes are moist.     Pharynx: Oropharynx is clear.  Eyes:     General:  Right eye: No discharge.        Left eye: No discharge.     Extraocular Movements: Extraocular movements intact.     Conjunctiva/sclera: Conjunctivae normal.     Pupils: Pupils are equal, round, and reactive to light.  Cardiovascular:     Rate and Rhythm: Normal rate and regular rhythm.     Heart sounds: No murmur heard. Pulmonary:     Effort: Pulmonary effort is normal. No respiratory distress.     Breath sounds: Normal breath sounds. No wheezing or rales.  Musculoskeletal:     Cervical back: Normal range of motion and neck supple.  Skin:    General: Skin is warm and dry.     Capillary Refill: Capillary refill takes less than 2 seconds.  Neurological:     General: No focal deficit present.      Mental Status: She is alert and oriented to person, place, and time. Mental status is at baseline.  Psychiatric:        Mood and Affect: Mood normal.        Behavior: Behavior normal.        Thought Content: Thought content normal.        Judgment: Judgment normal.    Results for orders placed or performed in visit on 07/15/21  Microscopic Examination   Urine  Result Value Ref Range   WBC, UA 0-5 0 - 5 /hpf   RBC 3-10 (A) 0 - 2 /hpf   Epithelial Cells (non renal) 0-10 0 - 10 /hpf   Mucus, UA Present (A) Not Estab.   Bacteria, UA Many (A) None seen/Few  Urine Culture   Specimen: Urine   UR  Result Value Ref Range   Urine Culture, Routine Final report (A)    Organism ID, Bacteria Citrobacter koseri (A)    Antimicrobial Susceptibility Comment   CBC with Differential/Platelet  Result Value Ref Range   WBC 10.8 3.4 - 10.8 x10E3/uL   RBC 4.58 3.77 - 5.28 x10E6/uL   Hemoglobin 13.4 11.1 - 15.9 g/dL   Hematocrit 40.1 34.0 - 46.6 %   MCV 88 79 - 97 fL   MCH 29.3 26.6 - 33.0 pg   MCHC 33.4 31.5 - 35.7 g/dL   RDW 13.2 11.7 - 15.4 %   Platelets 363 150 - 450 x10E3/uL   Neutrophils 65 Not Estab. %   Lymphs 26 Not Estab. %   Monocytes 6 Not Estab. %   Eos 2 Not Estab. %   Basos 1 Not Estab. %   Neutrophils Absolute 6.9 1.4 - 7.0 x10E3/uL   Lymphocytes Absolute 2.8 0.7 - 3.1 x10E3/uL   Monocytes Absolute 0.7 0.1 - 0.9 x10E3/uL   EOS (ABSOLUTE) 0.3 0.0 - 0.4 x10E3/uL   Basophils Absolute 0.1 0.0 - 0.2 x10E3/uL   Immature Granulocytes 0 Not Estab. %   Immature Grans (Abs) 0.0 0.0 - 0.1 x10E3/uL  Comprehensive metabolic panel  Result Value Ref Range   Glucose 140 (H) 65 - 99 mg/dL   BUN 12 6 - 24 mg/dL   Creatinine, Ser 0.60 0.57 - 1.00 mg/dL   eGFR 107 >59 mL/min/1.73   BUN/Creatinine Ratio 20 9 - 23   Sodium 139 134 - 144 mmol/L   Potassium 4.3 3.5 - 5.2 mmol/L   Chloride 97 96 - 106 mmol/L   CO2 25 20 - 29 mmol/L   Calcium 9.8 8.7 - 10.2 mg/dL   Total Protein 8.2 6.0 - 8.5  g/dL   Albumin  4.8 3.8 - 4.9 g/dL   Globulin, Total 3.4 1.5 - 4.5 g/dL   Albumin/Globulin Ratio 1.4 1.2 - 2.2   Bilirubin Total 0.5 0.0 - 1.2 mg/dL   Alkaline Phosphatase 90 44 - 121 IU/L   AST 23 0 - 40 IU/L   ALT 26 0 - 32 IU/L  Lipid panel  Result Value Ref Range   Cholesterol, Total 216 (H) 100 - 199 mg/dL   Triglycerides 226 (H) 0 - 149 mg/dL   HDL 45 >39 mg/dL   VLDL Cholesterol Cal 40 5 - 40 mg/dL   LDL Chol Calc (NIH) 131 (H) 0 - 99 mg/dL   Chol/HDL Ratio 4.8 (H) 0.0 - 4.4 ratio  TSH  Result Value Ref Range   TSH 2.510 0.450 - 4.500 uIU/mL  Urinalysis, Routine w reflex microscopic  Result Value Ref Range   Specific Gravity, UA 1.020 1.005 - 1.030   pH, UA 5.0 5.0 - 7.5   Color, UA Yellow Yellow   Appearance Ur Cloudy (A) Clear   Leukocytes,UA 1+ (A) Negative   Protein,UA Negative Negative/Trace   Glucose, UA Negative Negative   Ketones, UA Negative Negative   RBC, UA 3+ (A) Negative   Bilirubin, UA Negative Negative   Urobilinogen, Ur 0.2 0.2 - 1.0 mg/dL   Nitrite, UA Positive (A) Negative   Microscopic Examination See below:   Vitamin D (25 hydroxy)  Result Value Ref Range   Vit D, 25-Hydroxy 20.9 (L) 30.0 - 100.0 ng/mL  HgB A1c  Result Value Ref Range   Hgb A1c MFr Bld 6.9 (H) 4.8 - 5.6 %   Est. average glucose Bld gHb Est-mCnc 151 mg/dL  Microalbumin, Urine Waived  Result Value Ref Range   Microalb, Ur Waived 80 (H) 0 - 19 mg/L   Creatinine, Urine Waived 300 10 - 300 mg/dL   Microalb/Creat Ratio 30-300 (H) <30 mg/g  Cytology - PAP  Result Value Ref Range   Adequacy      Satisfactory for evaluation. The presence or absence of an   Adequacy      endocervical/transformation zone component cannot be determined because   Adequacy of atrophy.    Diagnosis      - Negative for intraepithelial lesion or malignancy (NILM)      Assessment & Plan:   Problem List Items Addressed This Visit       Cardiovascular and Mediastinum   Hypertension associated with  diabetes ([place]) - Primary    Chronic. Not well controlled.  Lengthy discussion had with patient today regarding benefits of getting blood pressure under control as well as taking Ace/ARB in the setting of diabetes.  Labs ordered today.  Will start Lisinopril 82m daily.  Side effects and benefits of medication discussed during visit. Follow up in 1 month.       Relevant Medications   lisinopril (ZESTRIL) 10 MG tablet   rosuvastatin (CRESTOR) 5 MG tablet   Other Relevant Orders   Comp Met (CMET)     Endocrine   Type 2 diabetes mellitus with hyperglycemia (HCC)    Chronic. Ongoing. Controlled on Metformin 5057mBID. Labs ordered during visit today.  Follow up in 6 months for reevaluation.  Call sooner if concerns arise.  If a1c is less than 6.5 will stop Metformin and allow patient to control with diet and exercise.         Relevant Medications   lisinopril (ZESTRIL) 10 MG tablet   rosuvastatin (CRESTOR) 5 MG tablet  Other Relevant Orders   HgB A1c     Other   Vitamin D deficiency    Labs ordered today. Will make recommendations based on lab results.       Relevant Orders   Vitamin D (25 hydroxy)   Hypercholesteremia    Chronic.  Not well controlled.  Lengthy discussion had with patient regarding benefits of statin in the setting of type 2 diabetes.  Agrees to start Crestor 76m.  Side effects and benefits discussed during visit.  Labs ordered today.        Relevant Medications   lisinopril (ZESTRIL) 10 MG tablet   rosuvastatin (CRESTOR) 5 MG tablet   Other Relevant Orders   Lipid Profile     Follow up plan: Return in about 1 month (around 04/25/2022) for BP Check.

## 2022-03-25 NOTE — Assessment & Plan Note (Addendum)
Chronic. Ongoing. Controlled on Metformin 500mg  BID. Labs ordered during visit today.  Follow up in 6 months for reevaluation.  Call sooner if concerns arise.  If a1c is less than 6.5 will stop Metformin and allow patient to control with diet and exercise.

## 2022-04-26 ENCOUNTER — Ambulatory Visit: Payer: Self-pay | Admitting: Nurse Practitioner

## 2022-05-25 ENCOUNTER — Ambulatory Visit: Payer: Self-pay | Admitting: Nurse Practitioner

## 2022-11-06 ENCOUNTER — Ambulatory Visit (INDEPENDENT_AMBULATORY_CARE_PROVIDER_SITE_OTHER): Payer: BC Managed Care – PPO

## 2022-11-06 ENCOUNTER — Ambulatory Visit
Admission: EM | Admit: 2022-11-06 | Discharge: 2022-11-06 | Disposition: A | Discharge status: Home / Self Care | Payer: BC Managed Care – PPO | Attending: Physician Assistant | Admitting: Physician Assistant

## 2022-11-06 ENCOUNTER — Encounter: Payer: Self-pay | Admitting: Emergency Medicine

## 2022-11-06 DIAGNOSIS — R059 Cough, unspecified: Secondary | ICD-10-CM | POA: Diagnosis not present

## 2022-11-06 DIAGNOSIS — R0989 Other specified symptoms and signs involving the circulatory and respiratory systems: Secondary | ICD-10-CM | POA: Diagnosis not present

## 2022-11-06 DIAGNOSIS — J180 Bronchopneumonia, unspecified organism: Secondary | ICD-10-CM | POA: Diagnosis not present

## 2022-11-06 DIAGNOSIS — R052 Subacute cough: Secondary | ICD-10-CM

## 2022-11-06 DIAGNOSIS — R062 Wheezing: Secondary | ICD-10-CM | POA: Diagnosis not present

## 2022-11-06 MED ORDER — DOXYCYCLINE HYCLATE 100 MG PO CAPS: 100.0000 mg | ORAL_CAPSULE | Freq: Two times a day (BID) | ORAL | 0 refills | Status: AC

## 2022-11-06 MED ORDER — PREDNISONE 10 MG PO TABS
ORAL_TABLET | ORAL | 0 refills | Status: DC
Start: 2022-11-06 — End: 2022-12-30

## 2022-11-06 MED ORDER — ALBUTEROL SULFATE HFA 108 (90 BASE) MCG/ACT IN AERS: 1.0000 | INHALATION_SPRAY | Freq: Four times a day (QID) | RESPIRATORY_TRACT | 0 refills | Status: AC | PRN

## 2022-11-06 NOTE — Discharge Instructions (Addendum)
-  The x-ray does not show any evidence of pneumonia.  There is a hazy patchy area which looks like it could be bordering on pneumonia.  I have sent antibiotics and prednisone to the pharmacy as well as an inhaler.  Increase rest and fluids. - If you are not better in the next couple weeks, follow-up with your PCP to see if you need more workup like possibly a CT scan.

## 2022-11-06 NOTE — ED Triage Notes (Signed)
Patient c/o sinus congestion and cough and chest congestion for 2 months.

## 2022-11-06 NOTE — ED Provider Notes (Signed)
[facility]    CSN: [id number] Arrival date & time: 11/06/22  1446      History   Chief Complaint Chief Complaint  Patient presents with   Cough   Sinus Problem    HPI Felicia Everett is a 57 y.o. female presenting for 2-1/56-month history of productive cough of yellowish-green sputum, congestion, sinus pressure.  Most of the nasal drainage is clear.  Reports wheezing but denies shortness of breath.  She reports a fever couple weeks ago but says she was exposed to the flu.  She says her symptoms have been pretty much intermittent.  States symptoms will go away for a few days and then come right back.  She has taken multiple over-the-counter medications including Mucinex and says it helps temporarily with her symptoms.  She does not have any history of asthma and has never been sick this long.  She has never smoked.  Her medical history significant for hypertension, allergies, anxiety, arthritis.  HPI  Past Medical History:  Diagnosis Date   Allergy    Anxiety    Arthritis    Hypertension    Obesity    Ovarian cyst 09/2015   right side    Patient Active Problem List   Diagnosis Date Noted   Hypercholesteremia 03/25/2022   Vitamin D deficiency 11/15/2020   Type 2 diabetes mellitus with hyperglycemia (HCC) 04/18/2020   Anxiety    Morbid obesity (HCC)    Hypertension associated with diabetes (HCC)     Past Surgical History:  Procedure Laterality Date   CESAREAN SECTION     OOPHORECTOMY     TUBAL LIGATION      OB History   No obstetric history on file.      Home Medications    Prior to Admission medications   Medication Sig Start Date End Date Taking? Authorizing Provider  albuterol (VENTOLIN HFA) 108 (90 Base) MCG/ACT inhaler Inhale 1-2 puffs into the lungs every 6 (six) hours as needed for wheezing or shortness of breath. 11/06/22  Yes Eusebio Friendly B, PA-C  doxycycline (VIBRAMYCIN) 100 MG capsule Take 1 capsule (100 mg total) by mouth 2 (two) times  daily for 7 days. 11/06/22 11/13/22 Yes Eusebio Friendly B, PA-C  metFORMIN (GLUCOPHAGE) 500 MG tablet TAKE 1 TABLET BY MOUTH TWICE DAILY WITH A MEAL . APPOINTMENT REQUIRED FOR FUTURE REFILLS IN  [facility] 03/25/22  Yes Larae Grooms, NP  predniSONE (DELTASONE) 10 MG tablet Take 6 tabs p.o. on day 1 and decrease by 1 tablet daily until complete 11/06/22  Yes Eusebio Friendly B, PA-C  IBUPROFEN PO Take by mouth as needed.    [provider]  lisinopril (ZESTRIL) 10 MG tablet Take 1 tablet (10 mg total) by mouth daily. 03/25/22   Larae Grooms, NP  LORATADINE ALLERGY RELIEF PO Take by mouth as needed.    [provider]  rosuvastatin (CRESTOR) 5 MG tablet Take 1 tablet (5 mg total) by mouth daily. 03/25/22   Larae Grooms, NP    Family History Family History  Problem Relation Age of Onset   COPD Mother    Heart disease Father        possible heart attack   Alcohol abuse Father    Drug abuse Daughter    Cancer Maternal Grandmother    Diabetes Neg Hx    Hypertension Neg Hx    Stroke Neg Hx    Breast cancer Neg Hx     Social History Social History   Tobacco  Use   Smoking status: Never   Smokeless tobacco: Never  Vaping Use   Vaping Use: Never used  Substance Use Topics   Alcohol use: No   Drug use: No     Allergies   Ibuprofen, Red dye, and Latex   Review of Systems Review of Systems  Constitutional:  Negative for chills, diaphoresis, fatigue and fever.  HENT:  Positive for congestion, rhinorrhea and sinus pressure. Negative for ear pain, sinus pain and sore throat.   Respiratory:  Positive for cough and wheezing. Negative for shortness of breath.   Cardiovascular:  Negative for chest pain.  Gastrointestinal:  Negative for abdominal pain, nausea and vomiting.  Musculoskeletal:  Negative for arthralgias and myalgias.  Skin:  Negative for rash.  Neurological:  Negative for weakness and headaches.  Hematological:  Negative for adenopathy.     Physical  Exam Triage Vital Signs ED Triage Vitals  Enc Vitals Group     BP      Pulse      Resp      Temp      Temp src      SpO2      Weight      Height      Head Circumference      Peak Flow      Pain Score      Pain Loc      Pain Edu?      Excl. in GC?    No data found.  Updated Vital Signs BP (!) 166/96 (BP Location: Right Arm) Comment: Patient has not taken BP medicine prescribed  Pulse 97   Temp 98.2 F (36.8 C) (Oral)   Resp 15   Ht 5\' 3"  (1.6 m)   Wt 201 lb (91.2 kg)   LMP 08/05/2015 (Approximate)   SpO2 95%   BMI 35.61 kg/m      Physical Exam Vitals and nursing note reviewed.  Constitutional:      General: She is not in acute distress.    Appearance: Normal appearance. She is not ill-appearing or toxic-appearing.  HENT:     Head: Normocephalic and atraumatic.     Nose: Congestion present.     Mouth/Throat:     Mouth: Mucous membranes are moist.     Pharynx: Oropharynx is clear.  Eyes:     General: No scleral icterus.       Right eye: No discharge.        Left eye: No discharge.     Conjunctiva/sclera: Conjunctivae normal.  Cardiovascular:     Rate and Rhythm: Normal rate and regular rhythm.     Heart sounds: Normal heart sounds.  Pulmonary:     Effort: Pulmonary effort is normal. No respiratory distress.     Breath sounds: Wheezing and rhonchi present.     Comments: Diffuse wheezing and rhonchi throughout which is worse on the left side. Musculoskeletal:     Cervical back: Neck supple.  Skin:    General: Skin is dry.  Neurological:     General: No focal deficit present.     Mental Status: She is alert. Mental status is at baseline.     Motor: No weakness.     Gait: Gait normal.  Psychiatric:        Mood and Affect: Mood normal.        Behavior: Behavior normal.        Thought Content: Thought content normal.      UC Treatments / Results  Labs (all labs ordered are listed, but only abnormal results are displayed) Labs Reviewed - No data to  display  EKG   Radiology DG Chest 2 View  Result Date: 11/06/2022 CLINICAL DATA:  Cough and congestion for 2 months EXAM: CHEST - 2 VIEW COMPARISON:  None Available. FINDINGS: Frontal and lateral views of the chest demonstrate an unremarkable cardiac silhouette. No airspace disease, effusion, or pneumothorax. No acute displaced fracture. IMPRESSION: 1. No acute intrathoracic process. Electronically Signed   By: Randa Ngo M.D.   On: 11/06/2022 15:36    Procedures Procedures (including critical care time)  Medications Ordered in UC Medications - No data to display  Initial Impression / Assessment and Plan / UC Course  I have reviewed the triage vital signs and the nursing notes.  Pertinent labs & imaging results that were available during my care of the patient were reviewed by me and considered in my medical decision making (see chart for details).   57 year old female presents for 2 and half month history of productive cough, congestion, wheezing.  Denies any recent fever.  Reports she has had a fever couple weeks ago.  She says her symptoms have been intermittent.  She has never had symptoms last as long.  No history of asthma, COPD and she has never smoked.  She is afebrile and overall well-appearing.  On exam she has mild nasal congestion without drainage.  She has diffuse wheezing and rhonchi throughout all lung fields but it is worse on the left side.  No distress.  X-ray today shows no acute abnormality according to radiologist.  Suspect bronchopneumonia.  Treating with doxycycline, prednisone, albuterol.  Advised her to follow-up with PCP especially if symptoms or not improving in the next couple weeks to see if she needs further workup like potentially a CT scan.   Final Clinical Impressions(s) / UC Diagnoses   Final diagnoses:  Bronchopneumonia  Wheezing  Subacute cough     Discharge Instructions      -The x-ray does not show any evidence of pneumonia.  There is  a hazy patchy area which looks like it could be bordering on pneumonia.  I have sent antibiotics and prednisone to the pharmacy as well as an inhaler.  Increase rest and fluids. - If you are not better in the next couple weeks, follow-up with your PCP to see if you need more workup like possibly a CT scan.     ED Prescriptions     Medication Sig Dispense Auth. Provider   predniSONE (DELTASONE) 10 MG tablet Take 6 tabs p.o. on day 1 and decrease by 1 tablet daily until complete 21 tablet Laurene Footman B, PA-C   doxycycline (VIBRAMYCIN) 100 MG capsule Take 1 capsule (100 mg total) by mouth 2 (two) times daily for 7 days. 14 capsule Laurene Footman B, PA-C   albuterol (VENTOLIN HFA) 108 (90 Base) MCG/ACT inhaler Inhale 1-2 puffs into the lungs every 6 (six) hours as needed for wheezing or shortness of breath. 1 g Danton Clap, PA-C      PDMP not reviewed this encounter.   Danton Clap, PA-C 11/06/22 [phone]

## 2022-12-29 IMAGING — MG MM DIGITAL SCREENING BILAT W/ TOMO AND CAD
6 of 12 series · 6 of 36 positions shown · non-contrast
Comparison: Previous exam(s).

ACR Breast Density Category a: The breast tissue is almost entirely
fatty.

CLINICAL DATA: Screening.

EXAM:
DIGITAL SCREENING BILATERAL MAMMOGRAM WITH TOMOSYNTHESIS AND CAD
TECHNIQUE: Bilateral screening digital craniocaudal and mediolateral oblique
mammograms were obtained. Bilateral screening digital breast
tomosynthesis was performed. The images were evaluated with
computer-aided detection.

[L MLO synth-2D]
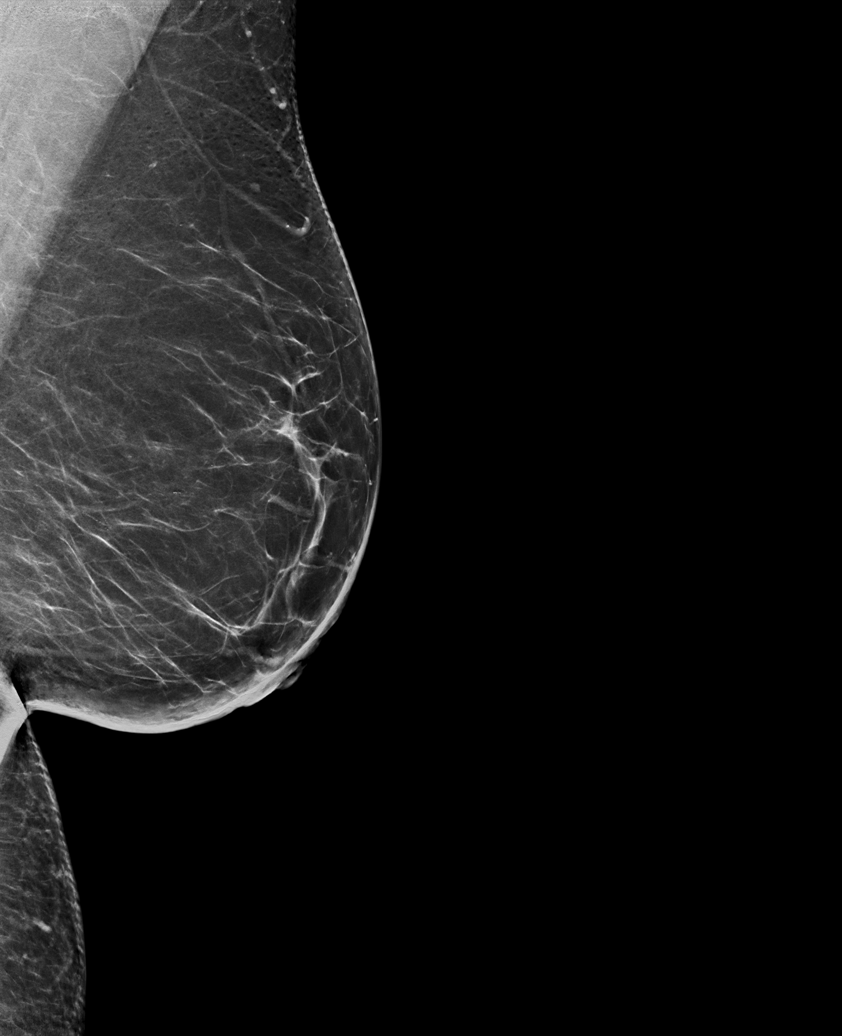

[L CC synth-2D (1 of 2)]
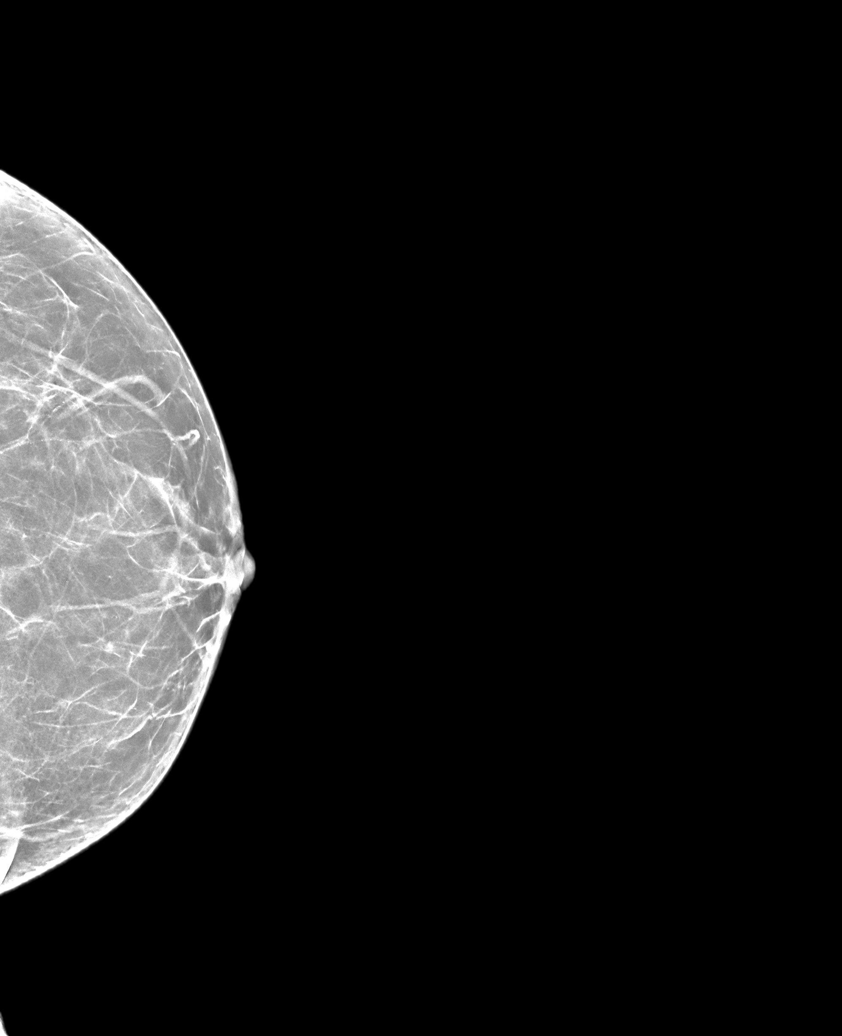

[R CC synth-2D (1 of 2)]
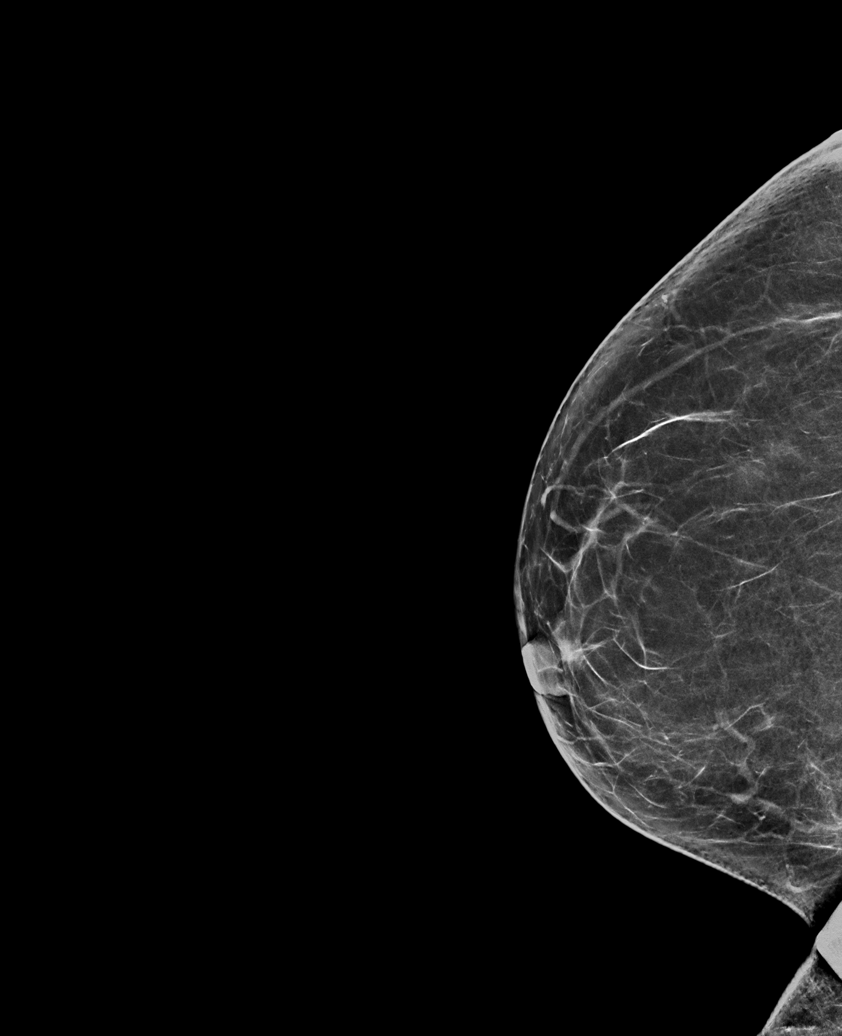

[R CC synth-2D (2 of 2)]
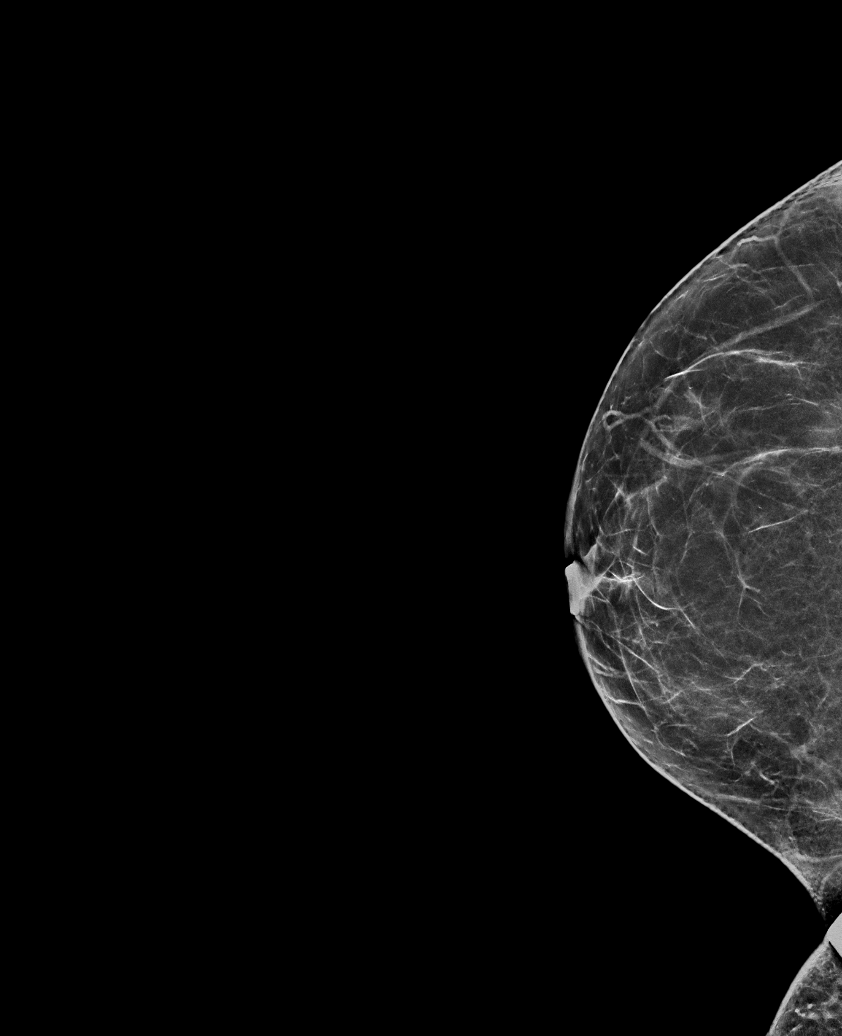

[R MLO synth-2D]
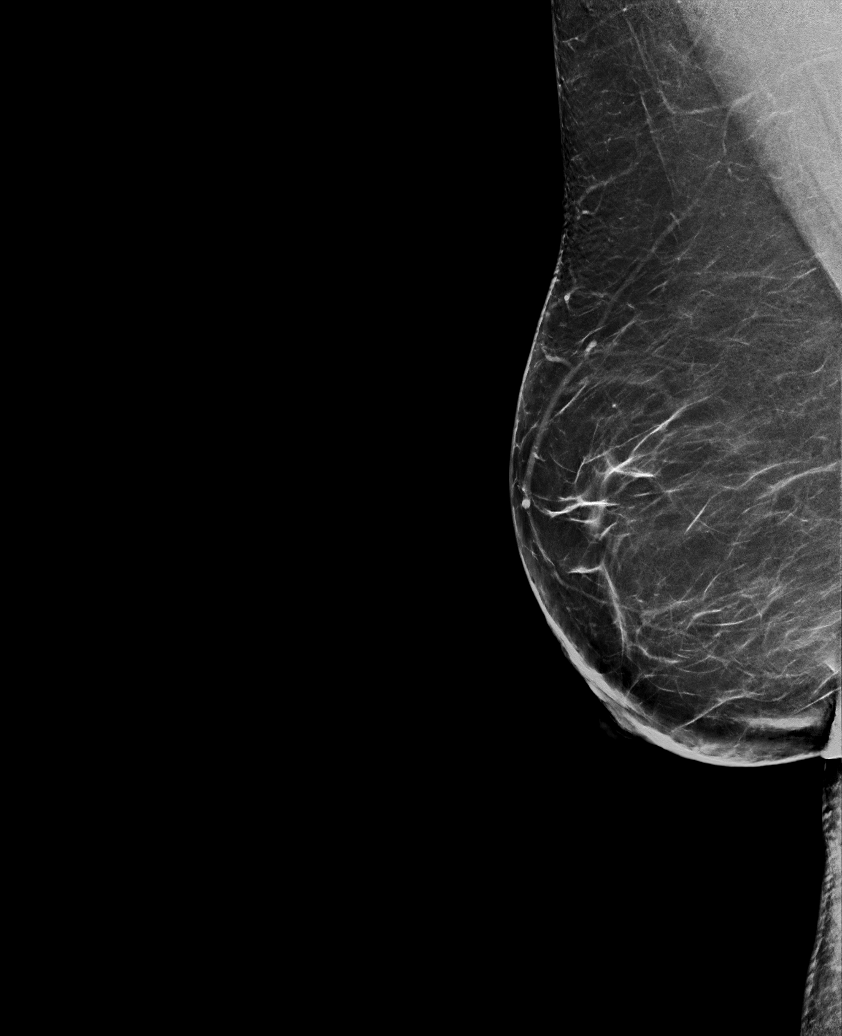

[L CC synth-2D (2 of 2)]
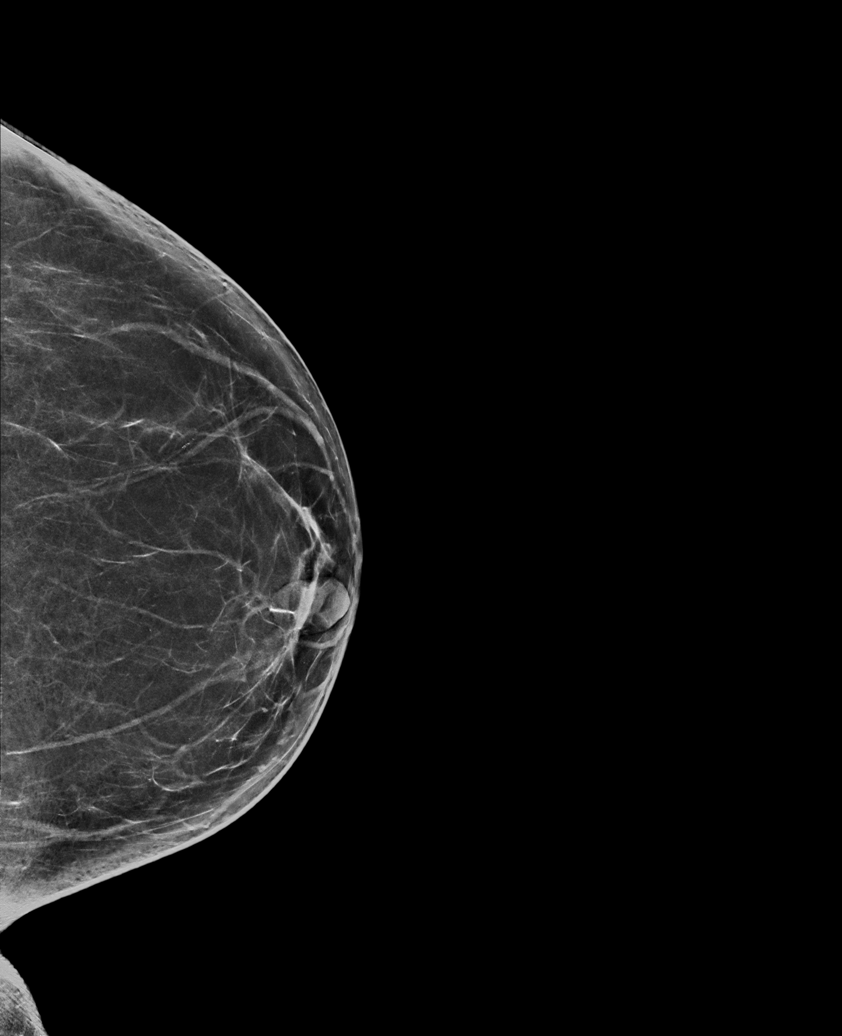

[6 of 36 positions shown; findings below may reference images not displayed]

FINDINGS: There are no findings suspicious for malignancy.
IMPRESSION: No mammographic evidence of malignancy. A result letter of this
screening mammogram will be mailed directly to the patient.

RECOMMENDATION:
Screening mammogram in one year. (Code:0E-3-N98)

BI-RADS CATEGORY  1: Negative.

## 2022-12-30 ENCOUNTER — Ambulatory Visit (INDEPENDENT_AMBULATORY_CARE_PROVIDER_SITE_OTHER): Payer: BC Managed Care – PPO | Admitting: Physician Assistant

## 2022-12-30 ENCOUNTER — Encounter: Payer: Self-pay | Admitting: Physician Assistant

## 2022-12-30 VITALS — BP 163/76 | HR 86 | Temp 98.1°F | Wt 205.5 lb

## 2022-12-30 DIAGNOSIS — L02214 Cutaneous abscess of groin: Secondary | ICD-10-CM

## 2022-12-30 MED ORDER — SULFAMETHOXAZOLE-TRIMETHOPRIM 800-160 MG PO TABS
1.0000 | ORAL_TABLET | Freq: Two times a day (BID) | ORAL | 0 refills | Status: DC
Start: 2022-12-30 — End: 2023-01-04

## 2022-12-30 NOTE — Progress Notes (Signed)
Acute Office Visit   Patient: Felicia Everett   DOB: 1966-01-01   57 y.o. Female  MRN: [id number] Visit Date: 12/30/2022  Today's healthcare provider: Dani Gobble Juvon Teater, PA-C  Introduced myself to the patient as a Journalist, newspaper and provided education on APPs in clinical practice.    Chief Complaint  Patient presents with   Recurrent Skin Infections    Pt states she has a boil that has come up in the crease between her thigh and vaginal area. States she first noticed it 2 weeks ago. Got better and then came back this past weekend. States it feels very long and has been draining some. States it is very painful and has been using warm compresses. Has also been taking ibuprofen and tylenol   Subjective    HPI HPI     Recurrent Skin Infections    Additional comments: Pt states she has a boil that has come up in the crease between her thigh and vaginal area. States she first noticed it 2 weeks ago. Got better and then came back this past weekend. States it feels very long and has been draining some. States it is very painful and has been using warm compresses. Has also been taking ibuprofen and tylenol      Last edited by Georgina Peer, CMA on 12/30/2022  9:33 AM.       Skin concern  Onset: gradual  Duration: about 2 weeks -started with small nodule that seemed to improve with warm compresses and soaks in tub Location: in groin area along inguinal crease Pain level and character: 7-9/10 , pain gets better with warm compresses and moving around a bit  Reports pain with walking where clothing rubs against it  Drainage: mild drainage of serous and some purulent drainage  Redness: yes and swelling   Interventions: warm compresses, soaked in Epsom salt baths, Tylenol and Ibuprofen       Medications: Outpatient Medications Prior to Visit  Medication Sig   albuterol (VENTOLIN HFA) 108 (90 Base) MCG/ACT inhaler Inhale 1-2 puffs into the lungs every 6 (six) hours as needed for  wheezing or shortness of breath.   IBUPROFEN PO Take by mouth as needed.   LORATADINE ALLERGY RELIEF PO Take by mouth as needed.   metFORMIN (GLUCOPHAGE) 500 MG tablet TAKE 1 TABLET BY MOUTH TWICE DAILY WITH A MEAL . APPOINTMENT REQUIRED FOR FUTURE REFILLS IN  MARCH   lisinopril (ZESTRIL) 10 MG tablet Take 1 tablet (10 mg total) by mouth daily. (Patient not taking: Reported on 12/30/2022)   rosuvastatin (CRESTOR) 5 MG tablet Take 1 tablet (5 mg total) by mouth daily. (Patient not taking: Reported on 12/30/2022)   [DISCONTINUED] predniSONE (DELTASONE) 10 MG tablet Take 6 tabs p.o. on day 1 and decrease by 1 tablet daily until complete   No facility-administered medications prior to visit.    Review of Systems  Skin:        Boil in inguinal crease        Objective    BP (!) 163/76   Pulse 86   Temp 98.1 F (36.7 C) (Oral)   Wt 205 lb 8 oz (93.2 kg)   LMP 08/05/2015 (Approximate)   SpO2 98%   BMI 36.40 kg/m    Physical Exam Vitals reviewed.  Constitutional:      General: She is awake.     Appearance: Normal appearance. She is well-developed and well-groomed.  HENT:  Head: Normocephalic and atraumatic.  Pulmonary:     Effort: Pulmonary effort is normal.  Skin:    General: Skin is warm and dry.     Capillary Refill: Capillary refill takes less than 2 seconds.     Findings: Abscess present.       Neurological:     General: No focal deficit present.     Mental Status: She is alert and oriented to person, place, and time. Mental status is at baseline.  Psychiatric:        Mood and Affect: Mood normal.        Behavior: Behavior normal. Behavior is cooperative.        Thought Content: Thought content normal.        Judgment: Judgment normal.       Diagnosis: abscess - Location: Left groin Procedure: Incision & drainage Informed consent:  Discussed risks (permanent loss of nail, permanent irregular growth of nail, infection, pain, bleeding, bruising, numbness, and  recurrence of the condition) and benefits of the procedure, as well as the alternatives.  Informed consent was obtained. Anesthesia: 5 mL lidocaine  Type: partial The area was prepared and draped in a standard fashion. The lesion drained pus and blood. A large amount of fluid was drained. The lesion was multiloculated. Antibiotic ointment and a sterile pressure dressing were applied. The patient tolerated the procedure well. The patient was instructed on post-op care.   No results found for any visits on 12/30/22.  Assessment & Plan      No follow-ups on file.     Problem List Items Addressed This Visit   None Visit Diagnoses     Abscess of groin, right    -  Primary Acute, new concern Reports she has noticed a cyst in the area about 2 weeks ago and thinks it gradually progressed to abscess PE demonstrates fluctuant punctate along the inferior aspect of her right groin that is actively draining purulent and serous drainage along with erythematous, indurated superior portion of cyst  Discussed several options for relief and recommend I&D today to relieve pain and pressure and starting oral abx for further coverage and management Patient was amenable to this plan and I&D was performed with significant purulent, bloody drainage noted during procedure.  Will start Bactrim 800-160 mg PO BID x 10 days and recommend follow up in office to assess response Reviewed home care measures- no manipulating area or trying to promote further drainage, warm soaks and gentle cleansing, keeping area covered with nonstick guaze to prevent irritation and contamination  Reviewed signs and symptoms of worsening condition and infection  Reviewed ED and return precautions- patient voiced understanding and agreement Follow up as needed for progressing or persistent concerns.     Relevant Medications   sulfamethoxazole-trimethoprim (BACTRIM DS) 800-160 MG tablet      At least 30 additional minutes were  spent with patient to accommodate I&D procedure, aftercare instructions and overall education regarding cc.    No follow-ups on file.   I, Levora Werden E Jessilyn Catino, PA-C, have reviewed all documentation for this visit. The documentation on 12/30/22 for the exam, diagnosis, procedures, and orders are all accurate and complete.   Talitha Givens, MHS, PA-C [facility]

## 2023-01-03 ENCOUNTER — Telehealth: Payer: Self-pay | Admitting: Nurse Practitioner

## 2023-01-03 NOTE — Telephone Encounter (Signed)
Patient called in states the last she took Bactrum was Thurs and Friday, all together 4 of them. She says she can't keep taking the [facility] because is makes her feel emotional and she starts crying, and still wants to go ahead and get something else after I let her now about fnishing the bactrum. Please cb to discuss

## 2023-01-03 NOTE — Telephone Encounter (Signed)
Patient seen by Junie Panning. Routing to her to advise.

## 2023-01-03 NOTE — Telephone Encounter (Unsigned)
Copied from [place] [id number]. Topic: General - Inquiry >> Jan 03, 2023 12:42 PM Teressa P wrote: Reason for CRM: pt came in last week for a boil and prescribed an antibiotic but she felt like over the weekend the medication was making her very emotional and shaky.  She stopped taking it but thinks she she to be taking something.  Please advise.  CB@  [phone]

## 2023-01-04 ENCOUNTER — Other Ambulatory Visit: Payer: Self-pay | Admitting: Physician Assistant

## 2023-01-04 DIAGNOSIS — L02214 Cutaneous abscess of groin: Secondary | ICD-10-CM

## 2023-01-04 MED ORDER — CEPHALEXIN 500 MG PO TABS: 500.0000 mg | ORAL_TABLET | Freq: Four times a day (QID) | ORAL | 0 refills | Status: AC

## 2023-01-04 NOTE — Progress Notes (Signed)
Keflex sent in to replace bactrim due to side effects

## 2023-01-04 NOTE — Telephone Encounter (Signed)
Called and LVM asking for patient to please return my call.   OK for P[facility] to advise patient of Erin's message if the patient calls back.

## 2023-01-05 NOTE — Telephone Encounter (Signed)
Called and notified patient of medication change. Patient states that she got notification about the Keflex, it was going to cost $162. Patient states she is going to try thr Bactrim again and she how she does. States she thinks she may have been more emotional worrying about the cyst and wants to try the medication again. States she will call if she has any issues or concerns.

## 2023-01-06 ENCOUNTER — Ambulatory Visit (INDEPENDENT_AMBULATORY_CARE_PROVIDER_SITE_OTHER): Payer: BC Managed Care – PPO | Admitting: Physician Assistant

## 2023-01-06 ENCOUNTER — Encounter: Payer: Self-pay | Admitting: Physician Assistant

## 2023-01-06 ENCOUNTER — Telehealth: Payer: Self-pay | Admitting: Nurse Practitioner

## 2023-01-06 VITALS — BP 163/85 | HR 94 | Temp 97.9°F | Wt 205.6 lb

## 2023-01-06 DIAGNOSIS — L02214 Cutaneous abscess of groin: Secondary | ICD-10-CM

## 2023-01-06 DIAGNOSIS — R21 Rash and other nonspecific skin eruption: Secondary | ICD-10-CM | POA: Diagnosis not present

## 2023-01-06 DIAGNOSIS — T368X5A Adverse effect of other systemic antibiotics, initial encounter: Secondary | ICD-10-CM

## 2023-01-06 NOTE — Telephone Encounter (Signed)
Copied from [place] [phone]. Topic: General - Other >> Jan 06, 2023 10:17 AM Sabas Sous wrote: Reason for CRM: Pt called requesting to speak to [place] regarding an abscess procedure she had last week. She is noticing coloration changes Best contact: [phone]

## 2023-01-06 NOTE — Progress Notes (Signed)
Acute Office Visit   Patient: Felicia Everett   DOB: 25-Oct-1966   57 y.o. Female  MRN: [id number] Visit Date: 01/06/2023  Today's healthcare provider: Dani Gobble Keron Koffman, PA-C  Introduced myself to the patient as a Journalist, newspaper and provided education on APPs in clinical practice.    Chief Complaint  Patient presents with   Cyst    Pt states she feels like she has noticed some skin discoloration around the area of the abscess that was drained 12/30/22   Subjective    HPI HPI     Cyst    Additional comments: Pt states she feels like she has noticed some skin discoloration around the area of the abscess that was drained 12/30/22      Last edited by Georgina Peer, CMA on 01/06/2023  1:40 PM.      Abscess follow up   Patient was seen in office on 12/30/22 for abscess drainage She was started on Bactrim but had a reaction so she was changed to Keflex on 01/04/23  She is here for follow up and has noticed changes to the area  She reports she hasn't had much pain since drainage at previous apt  She reports today she noticed increased redness  She has noted continued drainage since the I&D but it has turned more pink than bloody  She has continued to do warm compresses and soaks daily   She stopped taking the Bactrim Sat- Tues due to emotional reactions  She states Sat she started feeling anxious and noticed weakness and confusion - this got better when she stopped taking the Bactrim Keflex was sent in on 01/04/23 to replace the Bactrim but she did not take it as she was told it has the same side effects as the Bactrim  She has started taking the Bactrim again on Wed and has not started the Keflex at all    Medications: Outpatient Medications Prior to Visit  Medication Sig   albuterol (VENTOLIN HFA) 108 (90 Base) MCG/ACT inhaler Inhale 1-2 puffs into the lungs every 6 (six) hours as needed for wheezing or shortness of breath.   Cephalexin 500 MG tablet Take 1 tablet (500 mg total)  by mouth 4 (four) times daily for 7 days.   IBUPROFEN PO Take by mouth as needed.   LORATADINE ALLERGY RELIEF PO Take by mouth as needed.   metFORMIN (GLUCOPHAGE) 500 MG tablet TAKE 1 TABLET BY MOUTH TWICE DAILY WITH A MEAL . APPOINTMENT REQUIRED FOR FUTURE REFILLS IN  MARCH   lisinopril (ZESTRIL) 10 MG tablet Take 1 tablet (10 mg total) by mouth daily. (Patient not taking: Reported on 12/30/2022)   rosuvastatin (CRESTOR) 5 MG tablet Take 1 tablet (5 mg total) by mouth daily. (Patient not taking: Reported on 12/30/2022)   No facility-administered medications prior to visit.    Review of Systems  Constitutional:  Positive for chills and fatigue. Negative for fever.  Skin:        Abscess on right groin area   Psychiatric/Behavioral:  Positive for confusion and decreased concentration.        Objective    BP (!) 163/85   Pulse 94   Temp 97.9 F (36.6 C) (Oral)   Wt 205 lb 9.6 oz (93.3 kg)   LMP 08/05/2015 (Approximate)   SpO2 97%   BMI 36.42 kg/m    Physical Exam Vitals reviewed.  Constitutional:      General: She is awake.  Appearance: Normal appearance. She is well-developed and well-groomed.  HENT:     Head: Normocephalic and atraumatic.  Skin:    Findings: Abscess present.          Comments: Patient has rounded petechial rash on inner thigh along with several isolated lesions on thighs and stomach in various degrees of scabbing and ulceration    Neurological:     Mental Status: She is alert.  Psychiatric:        Behavior: Behavior is cooperative.       No results found for any visits on 01/06/23.  Assessment & Plan      Return in about 1 week (around 01/13/2023) for Abscess follow up .     Problem List Items Addressed This Visit       Other   Abscess of groin, right - Primary    Acute, ongoing concern Patient was seen for this on 12/30/22 and underwent successful I&D which provided relief  She notes she started Bactrim as directed but stopped on Sat  due to side effects - PE revealed several excoriated and scabbed lesions along lower abdomen and thighs - concern for drug eruption at this time Will add Bactrim to allergy list - discussed this with patient and she voiced understanding and agreement Abscess has overarching petechial rash extending to inner thigh that is mildly concerning for cellulitis at this time. Recommend she stop Bactrim immediately and start Keflex that was sent on 01/04/23 and finish entire course unless side effects or reaction occur.  Recommend she continue with warm compresses and warm soaks  Follow up in 1 week to assess progress and response to regimen change.       Other Visit Diagnoses     Excoriated rash            Return in about 1 week (around 01/13/2023) for Abscess follow up .   I, Siraj Dermody E Shaana Acocella, PA-C, have reviewed all documentation for this visit. The documentation on 01/06/23 for the exam, diagnosis, procedures, and orders are all accurate and complete.   Talitha Givens, MHS, PA-C [facility]

## 2023-01-06 NOTE — Assessment & Plan Note (Addendum)
Acute, ongoing concern Patient was seen for this on 12/30/22 and underwent successful I&D which provided relief  She notes she started Bactrim as directed but stopped on Sat due to side effects - PE revealed several excoriated and scabbed lesions along lower abdomen and thighs - concern for drug eruption at this time Will add Bactrim to allergy list - discussed this with patient and she voiced understanding and agreement Abscess has overarching petechial rash extending to inner thigh that is mildly concerning for cellulitis at this time. Recommend she stop Bactrim immediately and start Keflex that was sent on 01/04/23 and finish entire course unless side effects or reaction occur.  Recommend she continue with warm compresses and warm soaks  Follow up in 1 week to assess progress and response to regimen change.

## 2023-01-06 NOTE — Telephone Encounter (Signed)
Patient came in for appointment today with [facility].

## 2023-01-13 ENCOUNTER — Ambulatory Visit: Payer: BC Managed Care – PPO | Admitting: Physician Assistant

## 2023-01-20 ENCOUNTER — Ambulatory Visit: Payer: BC Managed Care – PPO | Admitting: Physician Assistant

## 2023-01-27 ENCOUNTER — Encounter: Payer: Self-pay | Admitting: Physician Assistant

## 2023-01-27 ENCOUNTER — Ambulatory Visit (INDEPENDENT_AMBULATORY_CARE_PROVIDER_SITE_OTHER): Payer: BC Managed Care – PPO | Admitting: Physician Assistant

## 2023-01-27 VITALS — BP 144/81 | HR 76 | Temp 97.9°F | Ht 63.0 in | Wt 199.6 lb

## 2023-01-27 DIAGNOSIS — Z5189 Encounter for other specified aftercare: Secondary | ICD-10-CM

## 2023-01-27 NOTE — Progress Notes (Signed)
Acute Office Visit   Patient: Felicia Everett   DOB: Aug 20, 1966   57 y.o. Female  MRN: [id number] Visit Date: 01/27/2023  Today's healthcare provider: Dani Gobble Scott Fix, PA-C  Introduced myself to the patient as a Journalist, newspaper and provided education on APPs in clinical practice.    Chief Complaint  Patient presents with   Wound Check   Hand Problem    Patient has been having issues with her hands itching and says she has been doing things out in her yard.    Eye Problem   Subjective    Wound Check  Eye Problem    HPI     Hand Problem    Additional comments: Patient has been having issues with her hands itching and says she has been doing things out in her yard.       Last edited by Irena Reichmann, Levan on 01/27/2023 10:24 AM.         Wound Check  Patient was treated for abscess in groin area  She reports resolution of symptoms She denies drainage, swelling, redness or pain at this time She has finished the entire course of Keflex without issue   Hand Concern  Onset: gradual  Duration: a few days  Rash: not really  Itching: mild, intermittent itching  Swelling: none  Redness: mild pink tint to left arm and hand compared to right  Interventions: none     Medications: Outpatient Medications Prior to Visit  Medication Sig   albuterol (VENTOLIN HFA) 108 (90 Base) MCG/ACT inhaler Inhale 1-2 puffs into the lungs every 6 (six) hours as needed for wheezing or shortness of breath.   IBUPROFEN PO Take by mouth as needed.   metFORMIN (GLUCOPHAGE) 500 MG tablet TAKE 1 TABLET BY MOUTH TWICE DAILY WITH A MEAL . APPOINTMENT REQUIRED FOR FUTURE REFILLS IN  MARCH   lisinopril (ZESTRIL) 10 MG tablet Take 1 tablet (10 mg total) by mouth daily. (Patient not taking: Reported on 12/30/2022)   LORATADINE ALLERGY RELIEF PO Take by mouth as needed. (Patient not taking: Reported on 01/27/2023)   rosuvastatin (CRESTOR) 5 MG tablet Take 1 tablet (5 mg total) by mouth daily. (Patient not  taking: Reported on 12/30/2022)   No facility-administered medications prior to visit.    Review of Systems  Skin:  Positive for color change (mild color change to left arm and hand along with intermittent itching).       Objective    BP (!) 144/81   Pulse 76   Temp 97.9 F (36.6 C) (Oral)   Ht 5\' 3"  (1.6 m)   Wt 199 lb 9.6 oz (90.5 kg)   LMP 08/05/2015 (Approximate)   SpO2 97%   BMI 35.36 kg/m    Physical Exam Vitals reviewed.  Constitutional:      General: She is awake.     Appearance: Normal appearance. She is well-developed and well-groomed.  HENT:     Head: Normocephalic and atraumatic.  Skin:    General: Skin is warm.     Capillary Refill: Capillary refill takes less than 2 seconds.          Comments: Mild pink tint to left forearm and hand Does not appear to be rash, burn, or lesion at this time   Neurological:     General: No focal deficit present.     Mental Status: She is alert and oriented to person, place, and time. Mental status is at baseline.  Psychiatric:        Mood and Affect: Mood normal.        Behavior: Behavior normal. Behavior is cooperative.        Thought Content: Thought content normal.        Judgment: Judgment normal.       No results found for any visits on 01/27/23.  Assessment & Plan      No follow-ups on file.      Problem List Items Addressed This Visit   None Visit Diagnoses     Wound check, abscess    -  Primary Acute, resolving abscess  Incision and drainage site appears well healed and groin area does not show evidence of ongoing abscess or cellulitis Approx 3 cm nodular mass is palpable along superior aspect of wound - she denies tenderness, swelling, redness, drainage  Suspect this is likely lingering cyst at this time Recommend continued warm compresses to the area to assist with resolution Reviewed return precautions and signs of recurrent infection Follow up as needed         No follow-ups on  file.   I, Currie Dennin E Elenora Hawbaker, PA-C, have reviewed all documentation for this visit. The documentation on 01/27/23 for the exam, diagnosis, procedures, and orders are all accurate and complete.   Talitha Givens, MHS, PA-C [facility]

## 2023-03-24 ENCOUNTER — Other Ambulatory Visit: Payer: Self-pay | Admitting: Nurse Practitioner

## 2023-03-24 NOTE — Telephone Encounter (Signed)
Requested medication (s) are due for refill today: yes  Requested medication (s) are on the active medication list: yes  Last refill:  03/25/22 #180 1 RF  Future visit scheduled: yes  Notes to clinic:  advised pt to fast for lab work/ called pt and made appt - pt requested Erin Mecum PA   Requested Prescriptions  Pending Prescriptions Disp Refills   metFORMIN (GLUCOPHAGE) 500 MG tablet [Pharmacy Med Name: metFORMIN HCl 500 MG Oral Tablet] 180 tablet 0    Sig: TAKE 1 TABLET BY MOUTH TWICE DAILY WITH  A  MEAL  -  APPOINTMENT  REQUIRED  FOR  FUTURE  REFILLS     Endocrinology:  Diabetes - Biguanides Failed - 03/24/2023  9:58 AM      Failed - Cr in normal range and within 360 days    Creatinine, Ser  Date Value Ref Range Status  07/15/2021 0.60 0.57 - 1.00 mg/dL Final         Failed - HBA1C is between 0 and 7.9 and within 180 days    Hgb A1c MFr Bld  Date Value Ref Range Status  07/15/2021 6.9 (H) 4.8 - 5.6 % Final    Comment:             Prediabetes: 5.7 - 6.4          Diabetes: >6.4          Glycemic control for adults with diabetes: <7.0          Failed - eGFR in normal range and within 360 days    GFR calc Af Amer  Date Value Ref Range Status  04/16/2020 117 >59 mL/min/1.73 Final    Comment:    **[facility] currently reports eGFR in compliance with the current**   recommendations of the [employer]. [facility] will   update reporting as new guidelines are published from the NKF-ASN   Task force.    GFR calc non Af Amer  Date Value Ref Range Status  04/16/2020 101 >59 mL/min/1.73 Final   eGFR  Date Value Ref Range Status  07/15/2021 107 >59 mL/min/1.73 Final         Failed - B12 Level in normal range and within 720 days    Vitamin B-12  Date Value Ref Range Status  06/03/2016 550 211 - 946 pg/mL Final         Failed - CBC within normal limits and completed in the last 12 months    WBC  Date Value Ref Range Status  07/15/2021 10.8 3.4 - 10.8  x10E3/uL Final   RBC  Date Value Ref Range Status  07/15/2021 4.58 3.77 - 5.28 x10E6/uL Final   Hemoglobin  Date Value Ref Range Status  07/15/2021 13.4 11.1 - 15.9 g/dL Final   Hematocrit  Date Value Ref Range Status  07/15/2021 40.1 34.0 - 46.6 % Final   MCHC  Date Value Ref Range Status  07/15/2021 33.4 31.5 - 35.7 g/dL Final   [facility]  Date Value Ref Range Status  07/15/2021 29.3 26.6 - 33.0 pg Final   MCV  Date Value Ref Range Status  07/15/2021 88 79 - 97 fL Final   No results found for: "PLTCOUNTKUC", "LABPLAT", "POCPLA" RDW  Date Value Ref Range Status  07/15/2021 13.2 11.7 - 15.4 % Final         Passed - Valid encounter within last 6 months    Recent Outpatient Visits  1 month ago Wound check, abscess   [place] [facility] Mecum, Oswaldo Conroy, PA-C   2 months ago Abscess of groin, right   [place] [facility] Mecum, Oswaldo Conroy, PA-C   2 months ago Abscess of groin, right   [place] [facility] Mecum, Oswaldo Conroy, PA-C   12 months ago Hypertension associated with diabetes [facility])   [place] [facility] Larae Grooms, NP   1 year ago Annual physical exam   [facility] Larae Grooms, NP       Future Appointments             In 1 week Mecum, Oswaldo Conroy, PA-C [facility], [facility]

## 2023-03-31 ENCOUNTER — Ambulatory Visit: Payer: BC Managed Care – PPO | Admitting: Physician Assistant

## 2023-04-07 ENCOUNTER — Encounter: Payer: Self-pay | Admitting: Physician Assistant

## 2023-04-07 ENCOUNTER — Ambulatory Visit (INDEPENDENT_AMBULATORY_CARE_PROVIDER_SITE_OTHER): Payer: BC Managed Care – PPO | Admitting: Physician Assistant

## 2023-04-07 VITALS — BP 137/71 | HR 68 | Temp 97.8°F | Ht 63.3 in | Wt 194.2 lb

## 2023-04-07 DIAGNOSIS — Z1159 Encounter for screening for other viral diseases: Secondary | ICD-10-CM

## 2023-04-07 DIAGNOSIS — I152 Hypertension secondary to endocrine disorders: Secondary | ICD-10-CM

## 2023-04-07 DIAGNOSIS — Z1231 Encounter for screening mammogram for malignant neoplasm of breast: Secondary | ICD-10-CM

## 2023-04-07 DIAGNOSIS — E7800 Pure hypercholesterolemia, unspecified: Secondary | ICD-10-CM | POA: Diagnosis not present

## 2023-04-07 DIAGNOSIS — Z1211 Encounter for screening for malignant neoplasm of colon: Secondary | ICD-10-CM

## 2023-04-07 DIAGNOSIS — E1165 Type 2 diabetes mellitus with hyperglycemia: Secondary | ICD-10-CM

## 2023-04-07 DIAGNOSIS — Z114 Encounter for screening for human immunodeficiency virus [HIV]: Secondary | ICD-10-CM

## 2023-04-07 DIAGNOSIS — E1159 Type 2 diabetes mellitus with other circulatory complications: Secondary | ICD-10-CM | POA: Diagnosis not present

## 2023-04-07 DIAGNOSIS — Z7984 Long term (current) use of oral hypoglycemic drugs: Secondary | ICD-10-CM

## 2023-04-07 NOTE — Progress Notes (Signed)
Established Patient Office Visit  Name: Felicia Everett   MRN: [id number]    DOB: 09/12/1966   Date:04/07/2023  Today's Provider: Jacquelin Hawking, MHS, PA-C Introduced myself to the patient as a PA-C and provided education on APPs in clinical practice.         Subjective  Chief Complaint  Chief Complaint  Patient presents with   Diabetes    No recent eye exam per patient, states she is only taking 1 Metformin per day. States taking it more than that hurts her stomach    Hyperlipidemia   Hypertension    HPI   Diabetes, Type 2 - Last A1c 6.9 - Medications: Metformin 500 mg PO QD - she reports taking it more frequently causes upset stomach  - Compliance: good  - Checking BG at home: no  - Diet: Is trying to drink more water. She is trying to reduce her sugar intake  - Exercise: She is walking more and trying to drink more water. She reports she is walking at [employer] everyday - usually a few hours with grandchildren  - Eye exam: discussed importance of annual eye exam for screening  - Foot exam: completed today  - Microalbumin: ordered today  - Statin: Not taking  - PNA vaccine: NA  - Denies symptoms of hypoglycemia, polyuria, polydipsia, numbness extremities, foot ulcers/trauma  HYPERTENSION / HYPERLIPIDEMIA Satisfied with current treatment? yes Duration of hypertension: years BP monitoring frequency: daily BP range: 130s/70s  BP medication side effects:  NA Past BP meds: none she has not taken her previous rx  Duration of hyperlipidemia: years Cholesterol medication side effects: no Cholesterol supplements: none Past cholesterol medications: none Medication compliance: poor compliance Aspirin: no Recent stressors: no Recurrent headaches: no Visual changes: no Palpitations: no Dyspnea: no Chest pain: no Lower extremity edema: no Dizzy/lightheaded: no   Patient Active Problem List   Diagnosis Date Noted   Abscess of groin, right 01/06/2023    Hypercholesteremia 03/25/2022   Vitamin D deficiency 11/15/2020   Type 2 diabetes mellitus with hyperglycemia (HCC) 04/18/2020   Anxiety    Morbid obesity (HCC)    Hypertension associated with diabetes (HCC)     Past Surgical History:  Procedure Laterality Date   CESAREAN SECTION     OOPHORECTOMY     TUBAL LIGATION      Family History  Problem Relation Age of Onset   COPD Mother    Heart disease Father        possible heart attack   Alcohol abuse Father    Drug abuse Daughter    Cancer Maternal Grandmother    Diabetes Neg Hx    Hypertension Neg Hx    Stroke Neg Hx    Breast cancer Neg Hx     Social History   Tobacco Use   Smoking status: Never   Smokeless tobacco: Never  Substance Use Topics   Alcohol use: No     Current Outpatient Medications:    albuterol (VENTOLIN HFA) 108 (90 Base) MCG/ACT inhaler, Inhale 1-2 puffs into the lungs every 6 (six) hours as needed for wheezing or shortness of breath., Disp: 1 g, Rfl: 0   metFORMIN (GLUCOPHAGE) 500 MG tablet, TAKE 1 TABLET BY MOUTH TWICE DAILY WITH  A  MEAL  -  APPOINTMENT  REQUIRED  FOR  FUTURE  REFILLS, Disp: 60 tablet, Rfl: 0   lisinopril (ZESTRIL) 10 MG tablet, Take 1 tablet (10 mg total) by  mouth daily. (Patient not taking: Reported on 12/30/2022), Disp: 90 tablet, Rfl: 1   rosuvastatin (CRESTOR) 5 MG tablet, Take 1 tablet (5 mg total) by mouth daily. (Patient not taking: Reported on 12/30/2022), Disp: 90 tablet, Rfl: 1  Allergies  Allergen Reactions   Ibuprofen Hives    She can take the gel caps   Red Dye Hives   Bactrim [Sulfamethoxazole-Trimethoprim] Anxiety and Rash    Several excoriated lesions noted along lower trunk and extremities following use    Latex Rash    I personally reviewed active problem list, medication list, allergies, health maintenance, notes from last encounter, lab results with the patient/caregiver today.   Review of Systems  Constitutional:  Negative for chills and fever.  Eyes:   Negative for blurred vision, double vision and discharge.  Respiratory:  Negative for cough, shortness of breath and wheezing.   Cardiovascular:  Negative for chest pain, palpitations and leg swelling.  Musculoskeletal:  Negative for falls.  Neurological:  Negative for dizziness, loss of consciousness and headaches.      Objective  Vitals:   04/07/23 0822  BP: 137/71  Pulse: 68  Temp: 97.8 F (36.6 C)  TempSrc: Oral  SpO2: 98%  Weight: 194 lb 3.2 oz (88.1 kg)  Height: 5' 3.3" (1.608 m)    Body mass index is 34.08 kg/m.  Physical Exam Vitals reviewed.  Constitutional:      General: She is awake.     Appearance: Normal appearance. She is well-developed and well-groomed.  HENT:     Head: Normocephalic and atraumatic.  Cardiovascular:     Rate and Rhythm: Normal rate and regular rhythm.     Pulses: Normal pulses.          Radial pulses are 2+ on the right side and 2+ on the left side.       Dorsalis pedis pulses are 2+ on the right side and 2+ on the left side.       Posterior tibial pulses are 2+ on the right side and 2+ on the left side.     Heart sounds: Normal heart sounds. No murmur heard.    No friction rub. No gallop.  Pulmonary:     Effort: Pulmonary effort is normal.     Breath sounds: Normal breath sounds. No decreased air movement. No decreased breath sounds, wheezing, rhonchi or rales.  Musculoskeletal:     Right lower leg: No edema.     Left lower leg: No edema.  Neurological:     General: No focal deficit present.     Mental Status: She is alert and oriented to person, place, and time. Mental status is at baseline.     GCS: GCS eye subscore is 4. GCS verbal subscore is 5. GCS motor subscore is 6.  Psychiatric:        Attention and Perception: Attention and perception normal.        Mood and Affect: Mood and affect normal.        Speech: Speech normal.        Behavior: Behavior normal. Behavior is cooperative.      No results found for this or any  previous visit (from the past 2160 hour(s)).   PHQ2/9:    04/07/2023    8:35 AM 01/27/2023   10:27 AM 03/25/2022   10:48 AM 07/15/2021    9:18 AM 02/24/2021    8:31 AM  Depression screen PHQ 2/9  Decreased Interest 0 0 0 0 0  Down, Depressed, Hopeless 0 0 0 0 0  PHQ - 2 Score 0 0 0 0 0  Altered sleeping 1 0 1    Tired, decreased energy 0 0 1    Change in appetite 0 0 1    Feeling bad or failure about yourself  0 0 0    Trouble concentrating 0 0 1    Moving slowly or fidgety/restless 0 0 0    Suicidal thoughts 0 0 0    PHQ-9 Score 1 0 4    Difficult doing work/chores Not difficult at all  Not difficult at all        Fall Risk:    04/07/2023    8:34 AM 01/27/2023   10:27 AM 03/25/2022   10:47 AM 07/15/2021    9:18 AM 02/24/2021    8:31 AM  Fall Risk   Falls in the past year? 0 0 0 0 0  Number falls in past yr: 0 0 0 0 0  Injury with Fall? 0 0 0 0 0  Risk for fall due to : No Fall Risks No Fall Risks No Fall Risks No Fall Risks   Follow up Falls evaluation completed Falls evaluation completed Falls evaluation completed Falls evaluation completed       Functional Status Survey:      Assessment & Plan  Problem List Items Addressed This Visit       Cardiovascular and Mediastinum   Hypertension associated with diabetes (HCC)    Chronic, currently not controlled BP was close to goal in office and she reports similar readings at home  Recommend low dose ACE/ARB for kidney protection due to concurrent DM but patient is hesitant to add medications due to side effects/ potential reactions For now encouraged diet and exercise changes - she has lost about 10 lbs since March with lifestyle changes so far Will check labs today Recommend she continues to check BP at home  Follow up in about 3 months for monitoring or sooner if concerns arise       Relevant Orders   Comp Met (CMET)   CBC w/Diff     Endocrine   Type 2 diabetes mellitus with hyperglycemia (HCC)    Chronic,  historic condition She is taking Metformin 500 mg PO QD - states she is unable to tolerate BID dosing due to GI upset Will recheck A1c today- results to dictate further management  Recommend she continues with diet and exercise changes as she has lost about 10 lbs since March  Reviewed importance of annual eye exam for screening Completed foot exam and will recheck cholesterol for optimal management Follow up in 3 months for monitoring       Relevant Orders   HgB A1c   Microalbumin, Urine Waived (STAT)     Other   Morbid obesity (HCC)   Hypercholesteremia    Chronic, historic condition, not controlled at this time Patient did not start her Rosuvastatin due to concerns for reaction  She has made some lifestyle changes recently resulting in 10 lb weight loss- recommend she continues these along with reduced saturated fat intake.  Recheck lipid panel today- results to dictate further management, may be able to use supplement to assist with management and avoid pt anxiety Follow up in 6 months or sooner if concerns arise.       Relevant Orders   Lipid Profile   Other Visit Diagnoses     Encounter for screening mammogram for malignant neoplasm of breast    -  Primary   Relevant Orders   MM 3D SCREENING MAMMOGRAM BILATERAL BREAST   Encounter for hepatitis C screening test for low risk patient       Relevant Orders   Hepatitis C Antibody   Screening for HIV without presence of risk factors       Relevant Orders   HIV antibody (with reflex)   Colon cancer screening       Relevant Orders   Ambulatory referral to Gastroenterology        Return in about 3 months (around 07/08/2023) for annual phys- last one was 07/15/2021.   I, Mollyann Halbert E Adel Burch, PA-C, have reviewed all documentation for this visit. The documentation on 04/07/23 for the exam, diagnosis, procedures, and orders are all accurate and complete.   Jacquelin Hawking, MHS, PA-C [facility]

## 2023-04-07 NOTE — Assessment & Plan Note (Signed)
Chronic, historic condition, not controlled at this time Patient did not start her Rosuvastatin due to concerns for reaction  She has made some lifestyle changes recently resulting in 10 lb weight loss- recommend she continues these along with reduced saturated fat intake.  Recheck lipid panel today- results to dictate further management, may be able to use supplement to assist with management and avoid pt anxiety Follow up in 6 months or sooner if concerns arise.

## 2023-04-07 NOTE — Assessment & Plan Note (Signed)
Chronic, currently not controlled BP was close to goal in office and she reports similar readings at home  Recommend low dose ACE/ARB for kidney protection due to concurrent DM but patient is hesitant to add medications due to side effects/ potential reactions For now encouraged diet and exercise changes - she has lost about 10 lbs since March with lifestyle changes so far Will check labs today Recommend she continues to check BP at home  Follow up in about 3 months for monitoring or sooner if concerns arise

## 2023-04-07 NOTE — Assessment & Plan Note (Signed)
Chronic, historic condition She is taking Metformin 500 mg PO QD - states she is unable to tolerate BID dosing due to GI upset Will recheck A1c today- results to dictate further management  Recommend she continues with diet and exercise changes as she has lost about 10 lbs since March  Reviewed importance of annual eye exam for screening Completed foot exam and will recheck cholesterol for optimal management Follow up in 3 months for monitoring

## 2023-04-08 ENCOUNTER — Other Ambulatory Visit: Payer: BC Managed Care – PPO

## 2023-04-11 ENCOUNTER — Other Ambulatory Visit: Payer: BC Managed Care – PPO

## 2023-04-14 ENCOUNTER — Other Ambulatory Visit: Payer: BC Managed Care – PPO

## 2023-04-14 DIAGNOSIS — E7800 Pure hypercholesterolemia, unspecified: Secondary | ICD-10-CM | POA: Diagnosis not present

## 2023-04-14 DIAGNOSIS — Z1159 Encounter for screening for other viral diseases: Secondary | ICD-10-CM | POA: Diagnosis not present

## 2023-04-14 DIAGNOSIS — I152 Hypertension secondary to endocrine disorders: Secondary | ICD-10-CM

## 2023-04-14 DIAGNOSIS — E1159 Type 2 diabetes mellitus with other circulatory complications: Secondary | ICD-10-CM | POA: Diagnosis not present

## 2023-04-14 DIAGNOSIS — E1165 Type 2 diabetes mellitus with hyperglycemia: Secondary | ICD-10-CM

## 2023-04-14 DIAGNOSIS — Z114 Encounter for screening for human immunodeficiency virus [HIV]: Secondary | ICD-10-CM

## 2023-04-14 LAB — MICROALBUMIN, URINE WAIVED
Creatinine, Urine Waived: 200 mg/dL (ref 10–300)
Microalb, Ur Waived: 80 mg/L — ABNORMAL HIGH (ref 0–19)

## 2023-04-15 LAB — COMPREHENSIVE METABOLIC PANEL
ALT: 27 IU/L (ref 0–32)
AST: 29 IU/L (ref 0–40)
Albumin/Globulin Ratio: 1.2
Albumin: 4.3 g/dL (ref 3.8–4.9)
Alkaline Phosphatase: 87 IU/L (ref 44–121)
BUN/Creatinine Ratio: 26 — ABNORMAL HIGH (ref 9–23)
BUN: 18 mg/dL (ref 6–24)
Bilirubin Total: 0.4 mg/dL (ref 0.0–1.2)
CO2: 24 mmol/L (ref 20–29)
Calcium: 9.8 mg/dL (ref 8.7–10.2)
Chloride: 99 mmol/L (ref 96–106)
Creatinine, Ser: 0.69 mg/dL (ref 0.57–1.00)
Globulin, Total: 3.6 g/dL (ref 1.5–4.5)
Glucose: 158 mg/dL — ABNORMAL HIGH (ref 70–99)
Potassium: 4.1 mmol/L (ref 3.5–5.2)
Sodium: 139 mmol/L (ref 134–144)
Total Protein: 7.9 g/dL (ref 6.0–8.5)
eGFR: 102 mL/min/{1.73_m2} (ref >59)

## 2023-04-15 LAB — CBC WITH DIFFERENTIAL/PLATELET
Basophils Absolute: 0.1 10*3/uL (ref 0.0–0.2)
Basos: 1 %
EOS (ABSOLUTE): 0.3 10*3/uL (ref 0.0–0.4)
Eos: 4 %
Hematocrit: 39.8 % (ref 34.0–46.6)
Hemoglobin: 13 g/dL (ref 11.1–15.9)
Immature Grans (Abs): 0 10*3/uL (ref 0.0–0.1)
Immature Granulocytes: 0 %
Lymphocytes Absolute: 2.3 10*3/uL (ref 0.7–3.1)
Lymphs: 24 %
MCH: 28.9 pg (ref 26.6–33.0)
MCHC: 32.7 g/dL (ref 31.5–35.7)
MCV: 88 fL (ref 79–97)
Monocytes Absolute: 0.6 10*3/uL (ref 0.1–0.9)
Monocytes: 6 %
Neutrophils Absolute: 6.2 10*3/uL (ref 1.4–7.0)
Neutrophils: 65 %
Platelets: 284 10*3/uL (ref 150–450)
RBC: 4.5 x10E6/uL (ref 3.77–5.28)
RDW: 13.1 % (ref 11.7–15.4)
WBC: 9.5 10*3/uL (ref 3.4–10.8)

## 2023-04-15 LAB — LIPID PANEL
Chol/HDL Ratio: 5.1 ratio — ABNORMAL HIGH (ref 0.0–4.4)
Cholesterol, Total: 234 mg/dL — ABNORMAL HIGH (ref 100–199)
HDL: 46 mg/dL (ref >39)
LDL Chol Calc (NIH): 127 mg/dL — ABNORMAL HIGH (ref 0–99)
Triglycerides: 346 mg/dL — ABNORMAL HIGH (ref 0–149)
VLDL Cholesterol Cal: 61 mg/dL — ABNORMAL HIGH (ref 5–40)

## 2023-04-15 LAB — HEPATITIS C ANTIBODY: Hep C Virus Ab: NONREACTIVE

## 2023-04-15 LAB — HEMOGLOBIN A1C
Est. average glucose Bld gHb Est-mCnc: 174 mg/dL
Hgb A1c MFr Bld: 7.7 % — ABNORMAL HIGH (ref 4.8–5.6)

## 2023-04-15 LAB — HIV ANTIBODY (ROUTINE TESTING W REFLEX): HIV Screen 4th Generation wRfx: NONREACTIVE

## 2023-04-19 ENCOUNTER — Other Ambulatory Visit: Payer: Self-pay | Admitting: Physician Assistant

## 2023-04-19 DIAGNOSIS — E7800 Pure hypercholesterolemia, unspecified: Secondary | ICD-10-CM

## 2023-04-19 MED ORDER — ROSUVASTATIN CALCIUM 10 MG PO TABS: 10.0000 mg | ORAL_TABLET | Freq: Every day | ORAL | 1 refills | Status: DC

## 2023-04-19 NOTE — Progress Notes (Signed)
Your lab results have returned. Your A1c has increased to 7.7 which is above goal. I recommend that you take your Metformin twice per day for further management. We should recheck this in 3 months for monitoring Your cholesterol remains elevated.  Your LDL or "bad cholesterol" did improve but your total cholesterol has increased.  I recommend that we increase your cholesterol medication dose to provide better control.  After we increase we can recheck this at your next follow-up appointment in 3 months. Your electrolytes, liver and kidney function are overall in normal limits Your blood count is in normal limits. Your hepatitis C and HIV testing was negative. I have sent in an increased dose for your rosuvastatin.  If you still have some leftover from a previous prescription you can take 2 or you can discard them and just start taking the 10 mg which ever is easier for you.  Please schedule a follow-up in 3 months so we can recheck all of your labs.

## 2023-04-21 NOTE — Progress Notes (Signed)
Her urine creatinine and microalbumin ratio results are indicative of the progression of her diabetes. Diabetes can cause some damage to the blood vessels and filtering portions of the kidneys. This damage can allow proteins to spill out into your urine which will cause the results we see now. Getting your diabetes under better control can help reduce and prevent further damage - this is why I recommended the changes to your Metformin to help with the diabetes management.

## 2023-05-19 ENCOUNTER — Other Ambulatory Visit: Payer: Self-pay | Admitting: Nurse Practitioner

## 2023-05-20 NOTE — Telephone Encounter (Signed)
Requested Prescriptions  Pending Prescriptions Disp Refills   metFORMIN (GLUCOPHAGE) 500 MG tablet [Pharmacy Med Name: metFORMIN HCl 500 MG Oral Tablet] 180 tablet 0    Sig: TAKE 1 TABLET BY MOUTH TWICE DAILY WITH MEALS *APPOINTMENT REQUIRED FOR FURTHER REFILLS*     Endocrinology:  Diabetes - Biguanides Failed - 05/19/2023 12:35 PM      Failed - B12 Level in normal range and within 720 days    Vitamin B-12  Date Value Ref Range Status  06/03/2016 550 211 - 946 pg/mL Final         Passed - Cr in normal range and within 360 days    Creatinine, Ser  Date Value Ref Range Status  04/14/2023 0.69 0.57 - 1.00 mg/dL Final         Passed - HBA1C is between 0 and 7.9 and within 180 days    Hgb A1c MFr Bld  Date Value Ref Range Status  04/14/2023 7.7 (H) 4.8 - 5.6 % Final    Comment:             Prediabetes: 5.7 - 6.4          Diabetes: >6.4          Glycemic control for adults with diabetes: <7.0          Passed - eGFR in normal range and within 360 days    GFR calc Af Amer  Date Value Ref Range Status  04/16/2020 117 >59 mL/min/1.73 Final    Comment:    **[facility] currently reports eGFR in compliance with the current**   recommendations of the [employer]. [facility] will   update reporting as new guidelines are published from the NKF-ASN   Task force.    GFR calc non Af Amer  Date Value Ref Range Status  04/16/2020 101 >59 mL/min/1.73 Final   eGFR  Date Value Ref Range Status  04/14/2023 102 >59 mL/min/1.73 Final         Passed - Valid encounter within last 6 months    Recent Outpatient Visits           1 month ago Encounter for screening mammogram for malignant neoplasm of breast   [place] [facility] Mecum, Oswaldo Conroy, PA-C   3 months ago Wound check, abscess   [place] [facility] Mecum, Erin E, PA-C   4 months ago Abscess of groin, right   [place] [facility] Mecum, Erin E, PA-C   4 months ago Abscess of  groin, right   [place] [facility] Mecum, Erin E, PA-C   1 year ago Hypertension associated with diabetes [facility])   [place] [facility] Larae Grooms, NP       Future Appointments             In 2 months Larae Grooms, NP [place] [facility]            Passed - CBC within normal limits and completed in the last 12 months    WBC  Date Value Ref Range Status  04/14/2023 9.5 3.4 - 10.8 x10E3/uL Final   RBC  Date Value Ref Range Status  04/14/2023 4.50 3.77 - 5.28 x10E6/uL Final   Hemoglobin  Date Value Ref Range Status  04/14/2023 13.0 11.1 - 15.9 g/dL Final   Hematocrit  Date Value Ref Range Status  04/14/2023 39.8 34.0 - 46.6 % Final   MCHC  Date Value  Ref Range Status  04/14/2023 32.7 31.5 - 35.7 g/dL Final   [facility]  Date Value Ref Range Status  04/14/2023 28.9 26.6 - 33.0 pg Final   MCV  Date Value Ref Range Status  04/14/2023 88 79 - 97 fL Final   No results found for: "PLTCOUNTKUC", "LABPLAT", "POCPLA" RDW  Date Value Ref Range Status  04/14/2023 13.1 11.7 - 15.4 % Final

## 2023-05-31 ENCOUNTER — Ambulatory Visit: Payer: BC Managed Care – PPO

## 2023-06-15 ENCOUNTER — Telehealth: Payer: Self-pay

## 2023-06-15 NOTE — Telephone Encounter (Signed)
Called and LVM asking for patient to please return my call.  

## 2023-06-15 NOTE — Telephone Encounter (Signed)
-----   Message from Olevia Perches sent at 06/07/2023  1:58 PM EDT ----- Please schedule mammogram

## 2023-08-03 ENCOUNTER — Encounter: Payer: BC Managed Care – PPO | Admitting: Pediatrics

## 2023-08-08 ENCOUNTER — Telehealth: Payer: Self-pay | Admitting: Nurse Practitioner

## 2023-08-08 DIAGNOSIS — Z1211 Encounter for screening for malignant neoplasm of colon: Secondary | ICD-10-CM

## 2023-08-08 NOTE — Telephone Encounter (Addendum)
Referral Request - Has patient seen PCP for this complaint? Yes.   *If NO, is insurance requiring patient see PCP for this issue before PCP can refer them? Referral for which specialty: GI Preferred provider/office: [facility] Reason for referral: pt saw Denny Peon in June and she put in referral for a colonoscopy.  But pt would like the home test instead.

## 2023-08-08 NOTE — Telephone Encounter (Signed)
Patient notified that order has been placed for the Cologuard.

## 2023-08-08 NOTE — Telephone Encounter (Signed)
Cologuard ordered for patient.  

## 2023-08-10 ENCOUNTER — Encounter: Payer: BC Managed Care – PPO | Admitting: Pediatrics

## 2023-08-29 DIAGNOSIS — Z1211 Encounter for screening for malignant neoplasm of colon: Secondary | ICD-10-CM | POA: Diagnosis not present

## 2023-09-05 LAB — COLOGUARD: COLOGUARD: NEGATIVE

## 2023-09-24 DIAGNOSIS — I10 Essential (primary) hypertension: Secondary | ICD-10-CM | POA: Diagnosis not present

## 2023-09-24 DIAGNOSIS — R220 Localized swelling, mass and lump, head: Secondary | ICD-10-CM | POA: Diagnosis not present

## 2023-09-24 DIAGNOSIS — J340 Abscess, furuncle and carbuncle of nose: Secondary | ICD-10-CM | POA: Diagnosis not present

## 2023-10-18 ENCOUNTER — Encounter: Payer: BC Managed Care – PPO | Admitting: Nurse Practitioner

## 2023-11-08 ENCOUNTER — Encounter: Payer: BC Managed Care – PPO | Admitting: Nurse Practitioner

## 2023-11-22 ENCOUNTER — Encounter: Payer: BC Managed Care – PPO | Admitting: Nurse Practitioner

## 2023-11-22 NOTE — Progress Notes (Deleted)
LMP 08/05/2015 (Approximate)    Subjective:    Patient ID: Felicia Everett, female    DOB: 09/15/1966, 58 y.o.   MRN: [id number]  HPI: Felicia Everett is a 58 y.o. female presenting on 11/22/2023 for comprehensive medical examination. Current medical complaints include:none  She currently lives with: Menopausal Symptoms: no  HYPERTENSION Hypertension status: controlled  Satisfied with current treatment?  Not currently on medication Duration of hypertension: years BP monitoring frequency:  daily BP range: 120/80 BP medication side effects:  no Medication compliance:  not currently on medication Previous BP meds:none Aspirin: no Recurrent headaches: no Visual changes: no Palpitations: no Dyspnea: no Chest pain: no Lower extremity edema: no Dizzy/lightheaded: no  DIABETES Hypoglycemic episodes:no Polydipsia/polyuria: no Visual disturbance: no Chest pain: no Paresthesias: yes in feet Glucose Monitoring: no  Accucheck frequency: Not Checking  Fasting glucose:  Post prandial:  Evening:  Before meals: Taking Insulin?: no  Long acting insulin:  Short acting insulin: Blood Pressure Monitoring: weekly Retinal Examination: Not up to Date Foot Exam: Up to Date Diabetic Education: Not Completed Pneumovax: Not up to Date Influenza: Not up to Date Aspirin: yes  ANXIETY Patient states she feels like her anxiety is better.  Her grandson is now in preschool and less stressful for her.  Denies concerns at visit today. Denies SI.  Depression Screen done today and results listed below:     04/07/2023    8:35 AM 01/27/2023   10:27 AM 03/25/2022   10:48 AM 07/15/2021    9:18 AM 02/24/2021    8:31 AM  Depression screen PHQ 2/9  Decreased Interest 0 0 0 0 0  Down, Depressed, Hopeless 0 0 0 0 0  PHQ - 2 Score 0 0 0 0 0  Altered sleeping 1 0 1    Tired, decreased energy 0 0 1    Change in appetite 0 0 1    Feeling bad or failure about yourself  0 0 0    Trouble concentrating 0  0 1    Moving slowly or fidgety/restless 0 0 0    Suicidal thoughts 0 0 0    PHQ-9 Score 1 0 4    Difficult doing work/chores Not difficult at all  Not difficult at all      The patient does not have a history of falls. I did complete a risk assessment for falls. A plan of care for falls was documented.   Past Medical History:  Past Medical History:  Diagnosis Date   Allergy    Anxiety    Arthritis    Hypertension    Obesity    Ovarian cyst 09/2015   right side    Surgical History:  Past Surgical History:  Procedure Laterality Date   CESAREAN SECTION     OOPHORECTOMY     TUBAL LIGATION      Medications:  Current Outpatient Medications on File Prior to Visit  Medication Sig   albuterol (VENTOLIN HFA) 108 (90 Base) MCG/ACT inhaler Inhale 1-2 puffs into the lungs every 6 (six) hours as needed for wheezing or shortness of breath.   lisinopril (ZESTRIL) 10 MG tablet Take 1 tablet (10 mg total) by mouth daily. (Patient not taking: Reported on 12/30/2022)   metFORMIN (GLUCOPHAGE) 500 MG tablet TAKE 1 TABLET BY MOUTH TWICE DAILY WITH MEALS *APPOINTMENT REQUIRED FOR FURTHER REFILLS*   rosuvastatin (CRESTOR) 10 MG tablet Take 1 tablet (10 mg total) by mouth daily.   No current facility-administered medications on  file prior to visit.    Allergies:  Allergies  Allergen Reactions   Ibuprofen Hives    She can take the gel caps   Red Dye #40 (Allura Red) Hives   Bactrim [Sulfamethoxazole-Trimethoprim] Anxiety and Rash    Several excoriated lesions noted along lower trunk and extremities following use    Latex Rash    Social History:  Social History   Socioeconomic History   Marital status: Married    Spouse name: Not on file   Number of children: Not on file   Years of education: Not on file   Highest education level: Not on file  Occupational History   Not on file  Tobacco Use   Smoking status: Never   Smokeless tobacco: Never  Vaping Use   Vaping status: Never  Used  Substance and Sexual Activity   Alcohol use: No   Drug use: No   Sexual activity: Yes  Other Topics Concern   Not on file  Social History Narrative   Not on file   Social Drivers of Health   Financial Resource Strain: Not on file  Food Insecurity: Not on file  Transportation Needs: Not on file  Physical Activity: Not on file  Stress: Not on file  Social Connections: Not on file  Intimate Partner Violence: Not on file   Social History   Tobacco Use  Smoking Status Never  Smokeless Tobacco Never   Social History   Substance and Sexual Activity  Alcohol Use No    Family History:  Family History  Problem Relation Age of Onset   COPD Mother    Heart disease Father        possible heart attack   Alcohol abuse Father    Drug abuse Daughter    Cancer Maternal Grandmother    Diabetes Neg Hx    Hypertension Neg Hx    Stroke Neg Hx    Breast cancer Neg Hx     Past medical history, surgical history, medications, allergies, family history and social history reviewed with patient today and changes made to appropriate areas of the chart.   Review of Systems  Eyes:  Negative for blurred vision and double vision.  Respiratory:  Negative for shortness of breath.   Cardiovascular:  Negative for chest pain, palpitations and leg swelling.  Neurological:  Negative for dizziness and headaches.   All other ROS negative except what is listed above and in the HPI.      Objective:    LMP 08/05/2015 (Approximate)   Wt Readings from Last 3 Encounters:  04/07/23 194 lb 3.2 oz (88.1 kg)  01/27/23 199 lb 9.6 oz (90.5 kg)  01/06/23 205 lb 9.6 oz (93.3 kg)    Physical Exam Vitals and nursing note reviewed. Exam conducted with a chaperone present Wilhemena Durie, CMA).  Constitutional:      General: She is awake. She is not in acute distress.    Appearance: She is well-developed. She is obese. She is not ill-appearing.  HENT:     Head: Normocephalic and atraumatic.      Right Ear: Hearing, tympanic membrane, ear canal and external ear normal. No drainage.     Left Ear: Hearing, tympanic membrane, ear canal and external ear normal. No drainage.     Nose: Nose normal.     Right Sinus: No maxillary sinus tenderness or frontal sinus tenderness.     Left Sinus: No maxillary sinus tenderness or frontal sinus tenderness.     Mouth/Throat:  Mouth: Mucous membranes are moist.     Pharynx: Oropharynx is clear. Uvula midline. No pharyngeal swelling, oropharyngeal exudate or posterior oropharyngeal erythema.  Eyes:     General: Lids are normal.        Right eye: No discharge.        Left eye: No discharge.     Extraocular Movements: Extraocular movements intact.     Conjunctiva/sclera: Conjunctivae normal.     Pupils: Pupils are equal, round, and reactive to light.     Visual Fields: Right eye visual fields normal and left eye visual fields normal.  Neck:     Thyroid: No thyromegaly.     Vascular: No carotid bruit.     Trachea: Trachea normal.  Cardiovascular:     Rate and Rhythm: Normal rate and regular rhythm.     Heart sounds: Normal heart sounds. No murmur heard.    No gallop.  Pulmonary:     Effort: Pulmonary effort is normal. No accessory muscle usage or respiratory distress.     Breath sounds: Normal breath sounds.  Chest:  Breasts:    Right: Normal.     Left: Normal.  Abdominal:     General: Bowel sounds are normal.     Palpations: Abdomen is soft. There is no hepatomegaly or splenomegaly.     Tenderness: There is no abdominal tenderness.  Genitourinary:    General: Normal vulva.     Vagina: Vaginal discharge (thick white discharge) present.     Cervix: Normal.     Adnexa: Right adnexa normal and left adnexa normal.  Musculoskeletal:        General: Normal range of motion.     Cervical back: Normal range of motion and neck supple.     Right lower leg: No edema.     Left lower leg: No edema.  Lymphadenopathy:     Head:     Right side of  head: No submental, submandibular, tonsillar, preauricular or posterior auricular adenopathy.     Left side of head: No submental, submandibular, tonsillar, preauricular or posterior auricular adenopathy.     Cervical: No cervical adenopathy.     Upper Body:     Right upper body: No supraclavicular, axillary or pectoral adenopathy.     Left upper body: No supraclavicular, axillary or pectoral adenopathy.  Skin:    General: Skin is warm and dry.     Capillary Refill: Capillary refill takes less than 2 seconds.     Findings: No rash.  Neurological:     Mental Status: She is alert and oriented to person, place, and time.     Gait: Gait is intact.     Deep Tendon Reflexes: Reflexes are normal and symmetric.     Reflex Scores:      Brachioradialis reflexes are 2+ on the right side and 2+ on the left side.      Patellar reflexes are 2+ on the right side and 2+ on the left side. Psychiatric:        Attention and Perception: Attention normal.        Mood and Affect: Mood normal.        Speech: Speech normal.        Behavior: Behavior normal. Behavior is cooperative.        Thought Content: Thought content normal.        Judgment: Judgment normal.     Results for orders placed or performed in visit on 08/08/23  Cologuard   Collection  Time: 08/29/23  6:19 AM  Result Value Ref Range   COLOGUARD Negative Negative      Assessment & Plan:   Problem List Items Addressed This Visit   None     Follow up plan: No follow-ups on file.   LABORATORY TESTING:  - Pap smear: pap done  IMMUNIZATIONS:   - Tdap: Tetanus vaccination status reviewed: Refused. - Influenza:  Refused - Pneumovax: Refused - Prevnar: Not applicable - HPV: Not applicable - Zostavax vaccine: Refused  SCREENING: -Mammogram: Up to date  - Colonoscopy: Refused  - Bone Density: Not applicable  -Hearing Test: Not applicable  -Spirometry: Not applicable   PATIENT COUNSELING:   Advised to take 1 mg of folate  supplement per day if capable of pregnancy.   Sexuality: Discussed sexually transmitted diseases, partner selection, use of condoms, avoidance of unintended pregnancy  and contraceptive alternatives.   Advised to avoid cigarette smoking.  I discussed with the patient that most people either abstain from alcohol or drink within safe limits (<=14/week and <=4 drinks/occasion for males, <=7/weeks and <= 3 drinks/occasion for females) and that the risk for alcohol disorders and other health effects rises proportionally with the number of drinks per week and how often a drinker exceeds daily limits.  Discussed cessation/primary prevention of drug use and availability of treatment for abuse.   Diet: Encouraged to adjust caloric intake to maintain  or achieve ideal body weight, to reduce intake of dietary saturated fat and total fat, to limit sodium intake by avoiding high sodium foods and not adding table salt, and to maintain adequate dietary potassium and calcium preferably from fresh fruits, vegetables, and low-fat dairy products.    stressed the importance of regular exercise  Injury prevention: Discussed safety belts, safety helmets, smoke detector, smoking near bedding or upholstery.   Dental health: Discussed importance of regular tooth brushing, flossing, and dental visits.    NEXT PREVENTATIVE PHYSICAL DUE IN 1 YEAR. No follow-ups on file.

## 2023-11-29 ENCOUNTER — Ambulatory Visit: Payer: BC Managed Care – PPO | Admitting: Nurse Practitioner

## 2023-11-29 VITALS — BP 131/89 | HR 98 | Temp 98.0°F | Ht 63.3 in | Wt 201.0 lb

## 2023-11-29 DIAGNOSIS — E7800 Pure hypercholesterolemia, unspecified: Secondary | ICD-10-CM

## 2023-11-29 DIAGNOSIS — E559 Vitamin D deficiency, unspecified: Secondary | ICD-10-CM | POA: Diagnosis not present

## 2023-11-29 DIAGNOSIS — Z0000 Encounter for general adult medical examination without abnormal findings: Secondary | ICD-10-CM | POA: Diagnosis not present

## 2023-11-29 DIAGNOSIS — Z1231 Encounter for screening mammogram for malignant neoplasm of breast: Secondary | ICD-10-CM

## 2023-11-29 DIAGNOSIS — I10 Essential (primary) hypertension: Secondary | ICD-10-CM | POA: Diagnosis not present

## 2023-11-29 DIAGNOSIS — E1169 Type 2 diabetes mellitus with other specified complication: Secondary | ICD-10-CM

## 2023-11-29 LAB — URINALYSIS, ROUTINE W REFLEX MICROSCOPIC
Bilirubin, UA: NEGATIVE
Ketones, UA: NEGATIVE
Leukocytes,UA: NEGATIVE
Nitrite, UA: NEGATIVE
Protein,UA: NEGATIVE
RBC, UA: NEGATIVE
Specific Gravity, UA: 1.01 (ref 1.005–1.030)
Urobilinogen, Ur: 0.2 mg/dL (ref 0.2–1.0)
pH, UA: 5.5 (ref 5.0–7.5)

## 2023-11-29 MED ORDER — LISINOPRIL 10 MG PO TABS: 10.0000 mg | ORAL_TABLET | Freq: Every day | ORAL | 1 refills | Status: DC

## 2023-11-29 MED ORDER — RYBELSUS 7 MG PO TABS: 7.0000 mg | ORAL_TABLET | Freq: Every day | ORAL | 1 refills | Status: DC

## 2023-11-29 MED ORDER — ROSUVASTATIN CALCIUM 10 MG PO TABS: 10.0000 mg | ORAL_TABLET | Freq: Every day | ORAL | 1 refills | Status: AC

## 2023-11-29 NOTE — Assessment & Plan Note (Signed)
Chronic.  Last A1c was 7.7%.  Only taking Metformin 500mg  daily.  Having side effects from medication.  Agrees to start Rybelsus 7mg .  Discussed side effects and benefits of medication discussed during visit.  Lengthy discussion had regarding guidelines and recommendations of ACE/ARB and statin.  Discussed possibly starting Ozempic in the future.  Labs ordered today.  Follow up in 3 months.  Call sooner if concerns arise.

## 2023-11-29 NOTE — Assessment & Plan Note (Signed)
Chronic.  Has not been taking rosuvastatin.  Discussed benefits of medication discussed during visit.  Agrees to restart the medication.  Follow up in 3 months.  Call sooner if concerns arise.

## 2023-11-29 NOTE — Assessment & Plan Note (Signed)
Labs ordered at visit today.  Will make recommendations based on lab results.

## 2023-11-29 NOTE — Assessment & Plan Note (Signed)
Chronic.  Ongoing.  Has not been taking Lisinopril.  Discussed importance of taking medication at visit today.  Labs ordered.  Refills sent in.  Patient has agreed to restart medication.  Follow up in 3 months.  Call sooner if concerns arise.

## 2023-11-29 NOTE — Assessment & Plan Note (Signed)
Recommended eating smaller high protein, low fat meals more frequently and exercising 30 mins a day 5 times a week with a goal of 10-15lb weight loss in the next 3 months.

## 2023-11-29 NOTE — Progress Notes (Signed)
BP 131/89 (BP Location: Right Arm, Patient Position: Sitting, Cuff Size: Large)   Pulse 98   Temp 98 F (36.7 C) (Oral)   Ht 5' 3.3" (1.608 m)   Wt 201 lb (91.2 kg)   LMP 08/05/2015 (Approximate)   SpO2 97%   BMI 35.27 kg/m    Subjective:    Patient ID: Felicia Everett, female    DOB: [birth year], 58 y.o.   MRN: [id number]  HPI: Felicia Everett is a 58 y.o. female presenting on 11/29/2023 for comprehensive medical examination. Current medical complaints include:none  She currently lives with: Menopausal Symptoms: no  HYPERTENSION Patient has not been taking her Lisinopril.  She is going to restart the medication.   Hypertension status: controlled  Satisfied with current treatment?  Not currently on medication Duration of hypertension: years BP monitoring frequency:  at [employer] BP range: 132-138/89 BP medication side effects:  no Medication compliance:  not currently on medication Previous BP meds:none Aspirin: no Recurrent headaches: no Visual changes: no Palpitations: no Dyspnea: no Chest pain: no Lower extremity edema: no Dizzy/lightheaded: no  DIABETES Last A1c in June was 6.6%.  Currently on Metformin 500mg  once daily.  Taking the medication twice a day gives her diarrhea.   Hypoglycemic episodes:no Polydipsia/polyuria: no Visual disturbance: no Chest pain: no Paresthesias: yes in feet Glucose Monitoring: no  Accucheck frequency: Not Checking  Fasting glucose:  Post prandial:  Evening:  Before meals: Taking Insulin?: no  Long acting insulin:  Short acting insulin: Blood Pressure Monitoring: weekly Retinal Examination: Not up to Date Foot Exam: Up to Date Diabetic Education: Not Completed Pneumovax: Not up to Date Influenza: Not up to Date Aspirin: yes  Depression Screen done today and results listed below:     11/29/2023    2:01 PM 04/07/2023    8:35 AM 01/27/2023   10:27 AM 03/25/2022   10:48 AM 07/15/2021    9:18 AM  Depression screen PHQ 2/9   Decreased Interest 0 0 0 0 0  Down, Depressed, Hopeless 0 0 0 0 0  PHQ - 2 Score 0 0 0 0 0  Altered sleeping 0 1 0 1   Tired, decreased energy 0 0 0 1   Change in appetite 2 0 0 1   Feeling bad or failure about yourself  0 0 0 0   Trouble concentrating 0 0 0 1   Moving slowly or fidgety/restless 0 0 0 0   Suicidal thoughts 0 0 0 0   PHQ-9 Score 2 1 0 4   Difficult doing work/chores  Not difficult at all  Not difficult at all     The patient does not have a history of falls. I did complete a risk assessment for falls. A plan of care for falls was documented.   Past Medical History:  Past Medical History:  Diagnosis Date   Allergy    Anxiety    Arthritis    Hypertension    Obesity    Ovarian cyst 09/2015   right side    Surgical History:  Past Surgical History:  Procedure Laterality Date   CESAREAN SECTION     OOPHORECTOMY     TUBAL LIGATION      Medications:  Current Outpatient Medications on File Prior to Visit  Medication Sig   albuterol (VENTOLIN HFA) 108 (90 Base) MCG/ACT inhaler Inhale 1-2 puffs into the lungs every 6 (six) hours as needed for wheezing or shortness of breath.   metFORMIN (GLUCOPHAGE) 500  MG tablet TAKE 1 TABLET BY MOUTH TWICE DAILY WITH MEALS *APPOINTMENT REQUIRED FOR FURTHER REFILLS*   No current facility-administered medications on file prior to visit.    Allergies:  Allergies  Allergen Reactions   Ibuprofen Hives    She can take the gel caps   Red Dye #40 (Allura Red) Hives   Bactrim [Sulfamethoxazole-Trimethoprim] Anxiety and Rash    Several excoriated lesions noted along lower trunk and extremities following use    Latex Rash    Social History:  Social History   Socioeconomic History   Marital status: Married    Spouse name: Not on file   Number of children: Not on file   Years of education: Not on file   Highest education level: Associate degree: occupational, Scientist, product/process development, or vocational program  Occupational History   Not  on file  Tobacco Use   Smoking status: Never   Smokeless tobacco: Never  Vaping Use   Vaping status: Never Used  Substance and Sexual Activity   Alcohol use: No   Drug use: No   Sexual activity: Yes  Other Topics Concern   Not on file  Social History Narrative   Not on file   Social Drivers of Health   Financial Resource Strain: Low Risk  (11/29/2023)   Overall Financial Resource Strain (CARDIA)    Difficulty of Paying Living Expenses: Not very hard  Food Insecurity: No Food Insecurity (11/29/2023)   Hunger Vital Sign    Worried About Running Out of Food in the Last Year: Never true    Ran Out of Food in the Last Year: Never true  Transportation Needs: No Transportation Needs (11/29/2023)   PRAPARE - Administrator, Civil Service (Medical): No    Lack of Transportation (Non-Medical): No  Physical Activity: Insufficiently Active (11/29/2023)   Exercise Vital Sign    Days of Exercise per Week: 2 days    Minutes of Exercise per Session: 20 min  Stress: No Stress Concern Present (11/29/2023)   [employer] of Occupational Health - Occupational Stress Questionnaire    Feeling of Stress : Not at all  Social Connections: Socially Integrated (11/29/2023)   Social Connection and Isolation Panel [NHANES]    Frequency of Communication with Friends and Family: More than three times a week    Frequency of Social Gatherings with Friends and Family: Twice a week    Attends Religious Services: More than 4 times per year    Active Member of [employer] or Organizations: Yes    Attends Engineer, structural: More than 4 times per year    Marital Status: Married  Catering manager Violence: Not on file   Social History   Tobacco Use  Smoking Status Never  Smokeless Tobacco Never   Social History   Substance and Sexual Activity  Alcohol Use No    Family History:  Family History  Problem Relation Age of Onset   COPD Mother    Heart disease Father        possible  heart attack   Alcohol abuse Father    Drug abuse Daughter    Cancer Maternal Grandmother    Diabetes Neg Hx    Hypertension Neg Hx    Stroke Neg Hx    Breast cancer Neg Hx     Past medical history, surgical history, medications, allergies, family history and social history reviewed with patient today and changes made to appropriate areas of the chart.   Review of Systems  HENT:         Denies vision changes.  Eyes:  Negative for blurred vision and double vision.  Respiratory:  Negative for shortness of breath.   Cardiovascular:  Negative for chest pain, palpitations and leg swelling.  Neurological:  Negative for dizziness, tingling and headaches.  Endo/Heme/Allergies:  Negative for polydipsia.       Denies Polyuria   All other ROS negative except what is listed above and in the HPI.      Objective:    BP 131/89 (BP Location: Right Arm, Patient Position: Sitting, Cuff Size: Large)   Pulse 98   Temp 98 F (36.7 C) (Oral)   Ht 5' 3.3" (1.608 m)   Wt 201 lb (91.2 kg)   LMP 08/05/2015 (Approximate)   SpO2 97%   BMI 35.27 kg/m   Wt Readings from Last 3 Encounters:  11/29/23 201 lb (91.2 kg)  04/07/23 194 lb 3.2 oz (88.1 kg)  01/27/23 199 lb 9.6 oz (90.5 kg)    Physical Exam Vitals and nursing note reviewed.  Constitutional:      General: She is awake. She is not in acute distress.    Appearance: Normal appearance. She is well-developed. She is obese. She is not ill-appearing.  HENT:     Head: Normocephalic and atraumatic.     Right Ear: Hearing, tympanic membrane, ear canal and external ear normal. No drainage.     Left Ear: Hearing, tympanic membrane, ear canal and external ear normal. No drainage.     Nose: Nose normal.     Right Sinus: No maxillary sinus tenderness or frontal sinus tenderness.     Left Sinus: No maxillary sinus tenderness or frontal sinus tenderness.     Mouth/Throat:     Mouth: Mucous membranes are moist.     Pharynx: Oropharynx is clear. Uvula  midline. No pharyngeal swelling, oropharyngeal exudate or posterior oropharyngeal erythema.  Eyes:     General: Lids are normal.        Right eye: No discharge.        Left eye: No discharge.     Extraocular Movements: Extraocular movements intact.     Conjunctiva/sclera: Conjunctivae normal.     Pupils: Pupils are equal, round, and reactive to light.     Visual Fields: Right eye visual fields normal and left eye visual fields normal.  Neck:     Thyroid: No thyromegaly.     Vascular: No carotid bruit.     Trachea: Trachea normal.  Cardiovascular:     Rate and Rhythm: Normal rate and regular rhythm.     Heart sounds: Normal heart sounds. No murmur heard.    No gallop.  Pulmonary:     Effort: Pulmonary effort is normal. No accessory muscle usage or respiratory distress.     Breath sounds: Normal breath sounds.  Chest:  Breasts:    Right: Normal.     Left: Normal.  Abdominal:     General: Bowel sounds are normal.     Palpations: Abdomen is soft. There is no hepatomegaly or splenomegaly.     Tenderness: There is no abdominal tenderness.  Musculoskeletal:        General: Normal range of motion.     Cervical back: Normal range of motion and neck supple.     Right lower leg: No edema.     Left lower leg: No edema.  Lymphadenopathy:     Head:     Right side of head: No submental, submandibular,  tonsillar, preauricular or posterior auricular adenopathy.     Left side of head: No submental, submandibular, tonsillar, preauricular or posterior auricular adenopathy.     Cervical: No cervical adenopathy.     Upper Body:     Right upper body: No supraclavicular, axillary or pectoral adenopathy.     Left upper body: No supraclavicular, axillary or pectoral adenopathy.  Skin:    General: Skin is warm and dry.     Capillary Refill: Capillary refill takes less than 2 seconds.     Findings: No rash.  Neurological:     Mental Status: She is alert and oriented to person, place, and time.      Gait: Gait is intact.  Psychiatric:        Attention and Perception: Attention normal.        Mood and Affect: Mood normal.        Speech: Speech normal.        Behavior: Behavior normal. Behavior is cooperative.        Thought Content: Thought content normal.        Judgment: Judgment normal.     Results for orders placed or performed in visit on 08/08/23  Cologuard   Collection Time: 08/29/23  6:19 AM  Result Value Ref Range   COLOGUARD Negative Negative      Assessment & Plan:   Problem List Items Addressed This Visit       Cardiovascular and Mediastinum   Hypertension   Chronic.  Ongoing.  Has not been taking Lisinopril.  Discussed importance of taking medication at visit today.  Labs ordered.  Refills sent in.  Patient has agreed to restart medication.  Follow up in 3 months.  Call sooner if concerns arise.       Relevant Medications   lisinopril (ZESTRIL) 10 MG tablet   rosuvastatin (CRESTOR) 10 MG tablet   Other Relevant Orders   Comprehensive metabolic panel     Endocrine   Type 2 diabetes mellitus with other specified complication (HCC)   Chronic.  Last A1c was 7.7%.  Only taking Metformin 500mg  daily.  Having side effects from medication.  Agrees to start Rybelsus 7mg .  Discussed side effects and benefits of medication discussed during visit.  Lengthy discussion had regarding guidelines and recommendations of ACE/ARB and statin.  Discussed possibly starting Ozempic in the future.  Labs ordered today.  Follow up in 3 months.  Call sooner if concerns arise.       Relevant Medications   lisinopril (ZESTRIL) 10 MG tablet   Semaglutide (RYBELSUS) 7 MG TABS   rosuvastatin (CRESTOR) 10 MG tablet   Other Relevant Orders   Hemoglobin A1c     Other   Morbid obesity (HCC)   Recommended eating smaller high protein, low fat meals more frequently and exercising 30 mins a day 5 times a week with a goal of 10-15lb weight loss in the next 3 months.       Relevant  Medications   Semaglutide (RYBELSUS) 7 MG TABS   Vitamin D deficiency   Labs ordered at visit today.  Will make recommendations based on lab results.        Relevant Orders   Vitamin D (25 hydroxy)   Hypercholesteremia   Chronic.  Has not been taking rosuvastatin.  Discussed benefits of medication discussed during visit.  Agrees to restart the medication.  Follow up in 3 months.  Call sooner if concerns arise.       Relevant Medications  lisinopril (ZESTRIL) 10 MG tablet   rosuvastatin (CRESTOR) 10 MG tablet   Other Relevant Orders   Lipid panel   Other Visit Diagnoses       Annual physical exam    -  Primary   Health maintenance reviewed during visit today.  Labs ordred.  Vaccines reviewed. Colon cancer screening up to date.  PAP up to date.  Mammogram ordered.   Relevant Orders   CBC with Differential/Platelet   Comprehensive metabolic panel   Lipid panel   TSH   Urinalysis, Routine w reflex microscopic   Hemoglobin A1c   Vitamin D (25 hydroxy)     Encounter for screening mammogram for malignant neoplasm of breast       Relevant Orders   MM 3D SCREENING MAMMOGRAM BILATERAL BREAST         Follow up plan: Return in about 3 months (around 02/27/2024) for HTN, HLD, DM2 FU.   LABORATORY TESTING:  - Pap smear: Up to date  IMMUNIZATIONS:   - Tdap: Tetanus vaccination status reviewed: Refused. - Influenza:  Refused - Pneumovax: Refused - Prevnar: Not applicable - HPV: Not applicable - Zostavax vaccine: Refused  SCREENING: -Mammogram: Ordered today - Colonoscopy: Up to date - Bone Density: Not applicable  -Hearing Test: Not applicable  -Spirometry: Not applicable   PATIENT COUNSELING:   Advised to take 1 mg of folate supplement per day if capable of pregnancy.   Sexuality: Discussed sexually transmitted diseases, partner selection, use of condoms, avoidance of unintended pregnancy  and contraceptive alternatives.   Advised to avoid cigarette smoking.  I  discussed with the patient that most people either abstain from alcohol or drink within safe limits (<=14/week and <=4 drinks/occasion for males, <=7/weeks and <= 3 drinks/occasion for females) and that the risk for alcohol disorders and other health effects rises proportionally with the number of drinks per week and how often a drinker exceeds daily limits.  Discussed cessation/primary prevention of drug use and availability of treatment for abuse.   Diet: Encouraged to adjust caloric intake to maintain  or achieve ideal body weight, to reduce intake of dietary saturated fat and total fat, to limit sodium intake by avoiding high sodium foods and not adding table salt, and to maintain adequate dietary potassium and calcium preferably from fresh fruits, vegetables, and low-fat dairy products.    stressed the importance of regular exercise  Injury prevention: Discussed safety belts, safety helmets, smoke detector, smoking near bedding or upholstery.   Dental health: Discussed importance of regular tooth brushing, flossing, and dental visits.    NEXT PREVENTATIVE PHYSICAL DUE IN 1 YEAR. Return in about 3 months (around 02/27/2024) for HTN, HLD, DM2 FU.

## 2023-11-30 ENCOUNTER — Other Ambulatory Visit: Payer: Self-pay | Admitting: Nurse Practitioner

## 2023-11-30 ENCOUNTER — Encounter: Payer: Self-pay | Admitting: Nurse Practitioner

## 2023-11-30 LAB — HEMOGLOBIN A1C
Est. average glucose Bld gHb Est-mCnc: 266 mg/dL
Hgb A1c MFr Bld: 10.9 % — ABNORMAL HIGH (ref 4.8–5.6)

## 2023-11-30 LAB — COMPREHENSIVE METABOLIC PANEL
ALT: 35 [IU]/L — ABNORMAL HIGH (ref 0–32)
AST: 39 [IU]/L (ref 0–40)
Albumin: 4.3 g/dL (ref 3.8–4.9)
Alkaline Phosphatase: 127 [IU]/L — ABNORMAL HIGH (ref 44–121)
BUN/Creatinine Ratio: 20 (ref 9–23)
BUN: 13 mg/dL (ref 6–24)
Bilirubin Total: 0.3 mg/dL (ref 0.0–1.2)
CO2: 22 mmol/L (ref 20–29)
Calcium: 9.5 mg/dL (ref 8.7–10.2)
Chloride: 97 mmol/L (ref 96–106)
Creatinine, Ser: 0.66 mg/dL (ref 0.57–1.00)
Globulin, Total: 3.8 g/dL (ref 1.5–4.5)
Glucose: 379 mg/dL — ABNORMAL HIGH (ref 70–99)
Potassium: 4.3 mmol/L (ref 3.5–5.2)
Sodium: 133 mmol/L — ABNORMAL LOW (ref 134–144)
Total Protein: 8.1 g/dL (ref 6.0–8.5)
eGFR: 102 mL/min/{1.73_m2} (ref >59)

## 2023-11-30 LAB — LIPID PANEL
Chol/HDL Ratio: 6.7 {ratio} — ABNORMAL HIGH (ref 0.0–4.4)
Cholesterol, Total: 254 mg/dL — ABNORMAL HIGH (ref 100–199)
HDL: 38 mg/dL — ABNORMAL LOW (ref >39)
LDL Chol Calc (NIH): 108 mg/dL — ABNORMAL HIGH (ref 0–99)
Triglycerides: 623 mg/dL (ref 0–149)
VLDL Cholesterol Cal: 108 mg/dL — ABNORMAL HIGH (ref 5–40)

## 2023-11-30 LAB — VITAMIN D 25 HYDROXY (VIT D DEFICIENCY, FRACTURES): Vit D, 25-Hydroxy: 11.6 ng/mL — ABNORMAL LOW (ref 30.0–100.0)

## 2023-11-30 LAB — CBC WITH DIFFERENTIAL/PLATELET
Basophils Absolute: 0.1 10*3/uL (ref 0.0–0.2)
Basos: 1 %
EOS (ABSOLUTE): 0.2 10*3/uL (ref 0.0–0.4)
Eos: 2 %
Hematocrit: 41.2 % (ref 34.0–46.6)
Hemoglobin: 14 g/dL (ref 11.1–15.9)
Immature Grans (Abs): 0.1 10*3/uL (ref 0.0–0.1)
Immature Granulocytes: 1 %
Lymphocytes Absolute: 2.6 10*3/uL (ref 0.7–3.1)
Lymphs: 24 %
MCH: 29.8 pg (ref 26.6–33.0)
MCHC: 34 g/dL (ref 31.5–35.7)
MCV: 88 fL (ref 79–97)
Monocytes Absolute: 0.7 10*3/uL (ref 0.1–0.9)
Monocytes: 6 %
Neutrophils Absolute: 7.4 10*3/uL — ABNORMAL HIGH (ref 1.4–7.0)
Neutrophils: 66 %
Platelets: 307 10*3/uL (ref 150–450)
RBC: 4.7 x10E6/uL (ref 3.77–5.28)
RDW: 12.6 % (ref 11.7–15.4)
WBC: 11.1 10*3/uL — ABNORMAL HIGH (ref 3.4–10.8)

## 2023-11-30 LAB — TSH: TSH: 1.81 u[IU]/mL (ref 0.450–4.500)

## 2023-11-30 MED ORDER — VITAMIN D (ERGOCALCIFEROL) 1.25 MG (50000 UNIT) PO CAPS: 50000.0000 [IU] | ORAL_CAPSULE | ORAL | 0 refills | Status: DC

## 2023-11-30 NOTE — Addendum Note (Signed)
Addended by: Larae Grooms on: 11/30/2023 09:54 AM   Modules accepted: Orders

## 2023-12-02 NOTE — Telephone Encounter (Signed)
Requested Prescriptions  Pending Prescriptions Disp Refills   metFORMIN (GLUCOPHAGE) 500 MG tablet [Pharmacy Med Name: metFORMIN HCl 500 MG Oral Tablet] 180 tablet 0    Sig: TAKE 1 TABLET BY MOUTH TWICE DAILY WITH MEALS. APPT NEEDED FOR FURTHER REFILL     Endocrinology:  Diabetes - Biguanides Failed - 12/02/2023  8:18 AM      Failed - HBA1C is between 0 and 7.9 and within 180 days    Hgb A1c MFr Bld  Date Value Ref Range Status  11/29/2023 10.9 (H) 4.8 - 5.6 % Final    Comment:             Prediabetes: 5.7 - 6.4          Diabetes: >6.4          Glycemic control for adults with diabetes: <7.0          Failed - B12 Level in normal range and within 720 days    Vitamin B-12  Date Value Ref Range Status  06/03/2016 550 211 - 946 pg/mL Final         Failed - CBC within normal limits and completed in the last 12 months    WBC  Date Value Ref Range Status  11/29/2023 11.1 (H) 3.4 - 10.8 x10E3/uL Final   RBC  Date Value Ref Range Status  11/29/2023 4.70 3.77 - 5.28 x10E6/uL Final   Hemoglobin  Date Value Ref Range Status  11/29/2023 14.0 11.1 - 15.9 g/dL Final   Hematocrit  Date Value Ref Range Status  11/29/2023 41.2 34.0 - 46.6 % Final   MCHC  Date Value Ref Range Status  11/29/2023 34.0 31.5 - 35.7 g/dL Final   [facility]  Date Value Ref Range Status  11/29/2023 29.8 26.6 - 33.0 pg Final   MCV  Date Value Ref Range Status  11/29/2023 88 79 - 97 fL Final   No results found for: "PLTCOUNTKUC", "LABPLAT", "POCPLA" RDW  Date Value Ref Range Status  11/29/2023 12.6 11.7 - 15.4 % Final         Passed - Cr in normal range and within 360 days    Creatinine, Ser  Date Value Ref Range Status  11/29/2023 0.66 0.57 - 1.00 mg/dL Final         Passed - eGFR in normal range and within 360 days    GFR calc Af Amer  Date Value Ref Range Status  04/16/2020 117 >59 mL/min/1.73 Final    Comment:    **[facility] currently reports eGFR in compliance with the current**    recommendations of the [employer]. [facility] will   update reporting as new guidelines are published from the NKF-ASN   Task force.    GFR calc non Af Amer  Date Value Ref Range Status  04/16/2020 101 >59 mL/min/1.73 Final   eGFR  Date Value Ref Range Status  11/29/2023 102 >59 mL/min/1.73 Final         Passed - Valid encounter within last 6 months    Recent Outpatient Visits           3 days ago Annual physical exam   [place] [facility] Larae Grooms, NP   7 months ago Encounter for screening mammogram for malignant neoplasm of breast   [place] [facility] Mecum, Oswaldo Conroy, PA-C   10 months ago Wound check, abscess   [place] [facility] Mecum, Oswaldo Conroy, PA-C   11 months ago Abscess  of groin, right   [place] [facility] Mecum, Oswaldo Conroy, PA-C   11 months ago Abscess of groin, right   [place] [facility] Mecum, Oswaldo Conroy, PA-C       Future Appointments             In 3 months Larae Grooms, NP [place] [facility]

## 2024-03-08 ENCOUNTER — Ambulatory Visit: Payer: Self-pay | Admitting: Nurse Practitioner

## 2024-03-27 ENCOUNTER — Ambulatory Visit
Admission: RE | Admit: 2024-03-27 | Discharge: 2024-03-27 | Discharge status: Home / Self Care | Source: Ambulatory Visit | Attending: Nurse Practitioner

## 2024-03-27 DIAGNOSIS — Z1231 Encounter for screening mammogram for malignant neoplasm of breast: Secondary | ICD-10-CM

## 2024-04-03 ENCOUNTER — Ambulatory Visit: Admitting: Nurse Practitioner

## 2024-04-03 ENCOUNTER — Encounter: Payer: Self-pay | Admitting: Nurse Practitioner

## 2024-04-03 VITALS — BP 115/67 | HR 84 | Ht 63.0 in | Wt 194.0 lb

## 2024-04-03 DIAGNOSIS — E7800 Pure hypercholesterolemia, unspecified: Secondary | ICD-10-CM

## 2024-04-03 DIAGNOSIS — I10 Essential (primary) hypertension: Secondary | ICD-10-CM

## 2024-04-03 DIAGNOSIS — Z7984 Long term (current) use of oral hypoglycemic drugs: Secondary | ICD-10-CM

## 2024-04-03 DIAGNOSIS — E1169 Type 2 diabetes mellitus with other specified complication: Secondary | ICD-10-CM

## 2024-04-03 LAB — MICROALBUMIN, URINE WAIVED
Creatinine, Urine Waived: 100 mg/dL (ref 10–300)
Microalb, Ur Waived: 80 mg/L — ABNORMAL HIGH (ref 0–19)

## 2024-04-03 NOTE — Assessment & Plan Note (Signed)
 Chronic.  Controlled on current regimen of Lisinopril . Discussed importance of taking medication at visit today.  Labs ordered.  Refills sent in.  Patient has agreed to restart medication.  Follow up in 3 months.  Call sooner if concerns arise.

## 2024-04-03 NOTE — Assessment & Plan Note (Signed)
 Chronic.  Last A1c was 10.9%.  Only taking Metformin  500mg  daily.  Having side effects from medication.  Has not started Rybelsus  7mg .  Discussed side effects and benefits of medication discussed during visit.  Taking ACE for kidney protection.  Encouraged starting statin.  Declines injectable GLP1.   Labs ordered today.  Follow up in 3 months.  Call sooner if concerns arise.

## 2024-04-03 NOTE — Assessment & Plan Note (Signed)
 Labs ordered at visit today.  Will make recommendations based on lab results.

## 2024-04-03 NOTE — Assessment & Plan Note (Signed)
 Chronic.  Has lost 7lbs since last visit. Praised for weight loss.

## 2024-04-03 NOTE — Progress Notes (Signed)
 BP 115/67   Pulse 84   Ht 5\' 3"  (1.6 m)   Wt 194 lb (88 kg)   LMP 08/05/2015 (Approximate)   BMI 34.37 kg/m    Subjective:    Patient ID: Felicia Everett, female    DOB: 04/30/1966, 58 y.o.   MRN: [id number]  HPI: Felicia Everett is a 58 y.o. female  Chief Complaint  Patient presents with   Medical Management of Chronic Issues    Increased foot numbness   HYPERTENSION Patient has not been taking her Lisinopril .  Not taking the rosuvastatin .  She has been walking and feels like she has lost a few pounds. Hypertension status: controlled  Satisfied with current treatment? Not currently on medication Duration of hypertension: years BP monitoring frequency:  twice weekly BP range: 122/78 BP medication side effects:  no Medication compliance: not currently on medication Previous BP meds:none Aspirin: no Recurrent headaches: no Visual changes: no Palpitations: no Dyspnea: no Chest pain: no Lower extremity edema: no Dizzy/lightheaded: no  DIABETES Last A1c in January was 10.9%.  Currently on Metformin  500mg  once daily.  Taking the medication twice a day gives her diarrhea.  Has noticed an increase in her numbness.  She didn't start the Rybelsus  from the last visit.   Hypoglycemic episodes:no Polydipsia/polyuria: no Visual disturbance: no Chest pain: no Paresthesias: yes in feet Glucose Monitoring: no  Accucheck frequency: Not Checking  Fasting glucose:  Post prandial:  Evening:  Before meals: Taking Insulin?: no  Long acting insulin:  Short acting insulin: Blood Pressure Monitoring: weekly Retinal Examination: Not up to Date- plans to make an appt Foot Exam: Up to Date Diabetic Education: Not Completed Pneumovax: Not up to Date Influenza: Not up to Date Aspirin: yes  Relevant past medical, surgical, family and social history reviewed and updated as indicated. Interim medical history since our last visit reviewed. Allergies and medications reviewed and  updated.  Review of Systems  Eyes:  Negative for visual disturbance.  Respiratory:  Negative for cough, chest tightness and shortness of breath.   Cardiovascular:  Negative for chest pain, palpitations and leg swelling.  Endocrine: Negative for polydipsia and polyuria.  Neurological:  Positive for numbness. Negative for dizziness and headaches.    Per HPI unless specifically indicated above     Objective:     BP 115/67   Pulse 84   Ht 5\' 3"  (1.6 m)   Wt 194 lb (88 kg)   LMP 08/05/2015 (Approximate)   BMI 34.37 kg/m   Wt Readings from Last 3 Encounters:  04/03/24 194 lb (88 kg)  11/29/23 201 lb (91.2 kg)  04/07/23 194 lb 3.2 oz (88.1 kg)    Physical Exam Vitals and nursing note reviewed.  Constitutional:      General: She is not in acute distress.    Appearance: Normal appearance. She is normal weight. She is not ill-appearing, toxic-appearing or diaphoretic.  HENT:     Head: Normocephalic.     Right Ear: External ear normal.     Left Ear: External ear normal.     Nose: Nose normal.     Mouth/Throat:     Mouth: Mucous membranes are moist.     Pharynx: Oropharynx is clear.  Eyes:     General:        Right eye: No discharge.        Left eye: No discharge.     Extraocular Movements: Extraocular movements intact.     Conjunctiva/sclera: Conjunctivae normal.  Pupils: Pupils are equal, round, and reactive to light.  Cardiovascular:     Rate and Rhythm: Normal rate and regular rhythm.     Heart sounds: No murmur heard. Pulmonary:     Effort: Pulmonary effort is normal. No respiratory distress.     Breath sounds: Normal breath sounds. No wheezing or rales.  Musculoskeletal:     Cervical back: Normal range of motion and neck supple.  Skin:    General: Skin is warm and dry.     Capillary Refill: Capillary refill takes less than 2 seconds.  Neurological:     General: No focal deficit present.     Mental Status: She is alert and oriented to person, place, and time.  Mental status is at baseline.  Psychiatric:        Mood and Affect: Mood normal.        Behavior: Behavior normal.        Thought Content: Thought content normal.        Judgment: Judgment normal.     Results for orders placed or performed in visit on 11/29/23  Urinalysis, Routine w reflex microscopic   Collection Time: 11/29/23  2:26 PM  Result Value Ref Range   Specific Gravity, UA 1.010 1.005 - 1.030   pH, UA 5.5 5.0 - 7.5   Color, UA Yellow Yellow   Appearance Ur Clear Clear   Leukocytes,UA Negative Negative   Protein,UA Negative Negative/Trace   Glucose, UA 2+ (A) Negative   Ketones, UA Negative Negative   RBC, UA Negative Negative   Bilirubin, UA Negative Negative   Urobilinogen, Ur 0.2 0.2 - 1.0 mg/dL   Nitrite, UA Negative Negative   Microscopic Examination Comment   CBC with Differential/Platelet   Collection Time: 11/29/23  2:27 PM  Result Value Ref Range   WBC 11.1 (H) 3.4 - 10.8 x10E3/uL   RBC 4.70 3.77 - 5.28 x10E6/uL   Hemoglobin 14.0 11.1 - 15.9 g/dL   Hematocrit 54.0 98.1 - 46.6 %   MCV 88 79 - 97 fL   MCH 29.8 26.6 - 33.0 pg   MCHC 34.0 31.5 - 35.7 g/dL   RDW 19.1 47.8 - 29.5 %   Platelets 307 150 - 450 x10E3/uL   Neutrophils 66 Not Estab. %   Lymphs 24 Not Estab. %   Monocytes 6 Not Estab. %   Eos 2 Not Estab. %   Basos 1 Not Estab. %   Neutrophils Absolute 7.4 (H) 1.4 - 7.0 x10E3/uL   Lymphocytes Absolute 2.6 0.7 - 3.1 x10E3/uL   Monocytes Absolute 0.7 0.1 - 0.9 x10E3/uL   EOS (ABSOLUTE) 0.2 0.0 - 0.4 x10E3/uL   Basophils Absolute 0.1 0.0 - 0.2 x10E3/uL   Immature Granulocytes 1 Not Estab. %   Immature Grans (Abs) 0.1 0.0 - 0.1 x10E3/uL  Comprehensive metabolic panel   Collection Time: 11/29/23  2:27 PM  Result Value Ref Range   Glucose 379 (H) 70 - 99 mg/dL   BUN 13 6 - 24 mg/dL   Creatinine, Ser 6.21 0.57 - 1.00 mg/dL   eGFR 308 >65 HQ/ION/6.29   BUN/Creatinine Ratio 20 9 - 23   Sodium 133 (L) 134 - 144 mmol/L   Potassium 4.3 3.5 - 5.2  mmol/L   Chloride 97 96 - 106 mmol/L   CO2 22 20 - 29 mmol/L   Calcium  9.5 8.7 - 10.2 mg/dL   Total Protein 8.1 6.0 - 8.5 g/dL   Albumin 4.3 3.8 - 4.9 g/dL  Globulin, Total 3.8 1.5 - 4.5 g/dL   Bilirubin Total 0.3 0.0 - 1.2 mg/dL   Alkaline Phosphatase 127 (H) 44 - 121 IU/L   AST 39 0 - 40 IU/L   ALT 35 (H) 0 - 32 IU/L  Lipid panel   Collection Time: 11/29/23  2:27 PM  Result Value Ref Range   Cholesterol, Total 254 (H) 100 - 199 mg/dL   Triglycerides 161 (HH) 0 - 149 mg/dL   HDL 38 (L) >09 mg/dL   VLDL Cholesterol Cal 108 (H) 5 - 40 mg/dL   LDL Chol Calc (NIH) 604 (H) 0 - 99 mg/dL   Chol/HDL Ratio 6.7 (H) 0.0 - 4.4 ratio  TSH   Collection Time: 11/29/23  2:27 PM  Result Value Ref Range   TSH 1.810 0.450 - 4.500 uIU/mL  Hemoglobin A1c   Collection Time: 11/29/23  2:27 PM  Result Value Ref Range   Hgb A1c MFr Bld 10.9 (H) 4.8 - 5.6 %   Est. average glucose Bld gHb Est-mCnc 266 mg/dL  Vitamin D  (25 hydroxy)   Collection Time: 11/29/23  2:27 PM  Result Value Ref Range   Vit D, 25-Hydroxy 11.6 (L) 30.0 - 100.0 ng/mL      Assessment & Plan:   Problem List Items Addressed This Visit       Cardiovascular and Mediastinum   Hypertension   Chronic.  Controlled on current regimen of Lisinopril . Discussed importance of taking medication at visit today.  Labs ordered.  Refills sent in.  Patient has agreed to restart medication.  Follow up in 3 months.  Call sooner if concerns arise.         Endocrine   Type 2 diabetes mellitus with other specified complication (HCC) - Primary   Chronic.  Last A1c was 10.9%.  Only taking Metformin  500mg  daily.  Having side effects from medication.  Has not started Rybelsus  7mg .  Discussed side effects and benefits of medication discussed during visit.  Taking ACE for kidney protection.  Encouraged starting statin.  Declines injectable GLP1.   Labs ordered today.  Follow up in 3 months.  Call sooner if concerns arise.       Relevant Orders    Comp Met (CMET)   HgB A1c   Microalbumin, Urine Waived     Other   Morbid obesity (HCC)   Chronic.  Has lost 7lbs since last visit. Praised for weight loss.       Hypercholesteremia   Labs ordered at visit today.  Will make recommendations based on lab results.        Relevant Orders   Lipid Profile     Follow up plan: Return in about 3 months (around 07/04/2024) for HTN, HLD, DM2 FU.

## 2024-04-04 ENCOUNTER — Other Ambulatory Visit: Payer: Self-pay | Admitting: Nurse Practitioner

## 2024-04-04 ENCOUNTER — Ambulatory Visit: Payer: Self-pay | Admitting: Nurse Practitioner

## 2024-04-04 LAB — LIPID PANEL
Chol/HDL Ratio: 5.4 ratio — ABNORMAL HIGH (ref 0.0–4.4)
Cholesterol, Total: 237 mg/dL — ABNORMAL HIGH (ref 100–199)
HDL: 44 mg/dL (ref >39)
LDL Chol Calc (NIH): 120 mg/dL — ABNORMAL HIGH (ref 0–99)
Triglycerides: 417 mg/dL — ABNORMAL HIGH (ref 0–149)
VLDL Cholesterol Cal: 73 mg/dL — ABNORMAL HIGH (ref 5–40)

## 2024-04-04 LAB — COMPREHENSIVE METABOLIC PANEL WITH GFR
ALT: 27 IU/L (ref 0–32)
AST: 25 IU/L (ref 0–40)
Albumin: 4.3 g/dL (ref 3.8–4.9)
Alkaline Phosphatase: 87 IU/L (ref 44–121)
BUN/Creatinine Ratio: 30 — ABNORMAL HIGH (ref 9–23)
BUN: 22 mg/dL (ref 6–24)
Bilirubin Total: 0.4 mg/dL (ref 0.0–1.2)
CO2: 21 mmol/L (ref 20–29)
Calcium: 9.8 mg/dL (ref 8.7–10.2)
Chloride: 98 mmol/L (ref 96–106)
Creatinine, Ser: 0.74 mg/dL (ref 0.57–1.00)
Globulin, Total: 3.5 g/dL (ref 1.5–4.5)
Glucose: 172 mg/dL — ABNORMAL HIGH (ref 70–99)
Potassium: 4.2 mmol/L (ref 3.5–5.2)
Sodium: 135 mmol/L (ref 134–144)
Total Protein: 7.8 g/dL (ref 6.0–8.5)
eGFR: 94 mL/min/{1.73_m2} (ref >59)

## 2024-04-04 LAB — HEMOGLOBIN A1C
Est. average glucose Bld gHb Est-mCnc: 212 mg/dL
Hgb A1c MFr Bld: 9 % — ABNORMAL HIGH (ref 4.8–5.6)

## 2024-04-05 DIAGNOSIS — H524 Presbyopia: Secondary | ICD-10-CM | POA: Diagnosis not present

## 2024-04-05 LAB — HM DIABETES EYE EXAM

## 2024-04-05 NOTE — Telephone Encounter (Signed)
 Requested medications are due for refill today.  yes  Requested medications are on the active medications list.  yes  Last refill. 11/30/2023 #12 0 rf  Future visit scheduled.   yes  Notes to clinic.  Provider to review for this dosage.    Requested Prescriptions  Pending Prescriptions Disp Refills   Vitamin D , Ergocalciferol , (DRISDOL ) 1.25 MG (50000 UNIT) CAPS capsule [Pharmacy Med Name: VITAMIN D2 (ERGO) 1.25MG   CAP] 12 capsule 0    Sig: Take 1 capsule by mouth once a week     Endocrinology:  Vitamins - Vitamin D  Supplementation 2 Failed - 04/05/2024  1:24 PM      Failed - Manual Review: Route requests for 50,000 IU strength to the provider      Failed - Vitamin D  in normal range and within 360 days    Vit D, 25-Hydroxy  Date Value Ref Range Status  11/29/2023 11.6 (L) 30.0 - 100.0 ng/mL Final    Comment:    Vitamin D  deficiency has been defined by the Institute of Medicine and an Endocrine Society practice guideline as a level of serum 25-OH vitamin D  less than 20 ng/mL (1,2). The Endocrine Society went on to further define vitamin D  insufficiency as a level between 21 and 29 ng/mL (2). 1. IOM (Institute of Medicine). 2010. Dietary reference    intakes for calcium  and D. [place]: The    [employer]. 2. Holick MF, Binkley [place], Bischoff-Ferrari HA, et al.    Evaluation, treatment, and prevention of vitamin D     deficiency: an Endocrine Society clinical practice    guideline. JCEM. 2011 Jul; 96(7):1911-30.          Failed - Valid encounter within last 12 months    Recent Outpatient Visits           2 days ago Type 2 diabetes mellitus with other specified complication, without long-term current use of insulin [facility])   [facility] Aileen Alexanders, NP              Passed - Ca in normal range and within 360 days    Calcium   Date Value Ref Range Status  04/03/2024 9.8 8.7 - 10.2 mg/dL Final

## 2024-04-23 ENCOUNTER — Ambulatory Visit: Admitting: Nurse Practitioner

## 2024-04-23 NOTE — Progress Notes (Deleted)
   LMP 08/05/2015 (Approximate)    Subjective:    Patient ID: Felicia Everett, female    DOB: 10/21/1966, 58 y.o.   MRN: [id number]  HPI: Felicia Everett is a 58 y.o. female  No chief complaint on file.   Relevant past medical, surgical, family and social history reviewed and updated as indicated. Interim medical history since our last visit reviewed. Allergies and medications reviewed and updated.  Review of Systems  Per HPI unless specifically indicated above     Objective:    LMP 08/05/2015 (Approximate)   Wt Readings from Last 3 Encounters:  04/03/24 194 lb (88 kg)  11/29/23 201 lb (91.2 kg)  04/07/23 194 lb 3.2 oz (88.1 kg)    Physical Exam  Results for orders placed or performed in visit on 04/11/24  HM DIABETES EYE EXAM   Collection Time: 04/05/24  8:27 AM  Result Value Ref Range   HM Diabetic Eye Exam No Retinopathy No Retinopathy      Assessment & Plan:   Problem List Items Addressed This Visit   None    Follow up plan: No follow-ups on file.

## 2024-04-24 ENCOUNTER — Encounter: Payer: Self-pay | Admitting: Nurse Practitioner

## 2024-04-24 ENCOUNTER — Ambulatory Visit: Admitting: Nurse Practitioner

## 2024-04-24 VITALS — BP 136/82 | HR 91 | Ht 63.0 in | Wt 194.0 lb

## 2024-04-24 DIAGNOSIS — W57XXXA Bitten or stung by nonvenomous insect and other nonvenomous arthropods, initial encounter: Secondary | ICD-10-CM | POA: Diagnosis not present

## 2024-04-24 DIAGNOSIS — R5383 Other fatigue: Secondary | ICD-10-CM | POA: Diagnosis not present

## 2024-04-24 NOTE — Progress Notes (Addendum)
 BP 136/82   Pulse 91   Ht 5' 3 (1.6 m)   Wt 194 lb (88 kg)   LMP 08/05/2015 (Approximate)   SpO2 98%   BMI 34.37 kg/m    Subjective:    Patient ID: Felicia Everett, female    DOB: Aug 14, 1966, 58 y.o.   MRN: [id number]  HPI: Felicia Everett is a 58 y.o. female  Chief Complaint  Patient presents with   Insect Bite    Beginning of May, left thigh, then a few days later a tick between toes, doesn't think it was a lone star tick   Fatigue    A couple times since tick bites with nausea and chills   Patient states she has been bit by 2 ticks that were within a week of each other.  One was on her upper left thigh and one was between her toes on her right toes.  She had gotten sick for a couple of days with nausea and vomiting.  No one else she was around had been sick.  Then she got sick a second time for a couple of days.  Felt like she couldn't respond or move.  She wasn't getting up to eat or do anything for 3 days.  Then happened a 3rd time over the weekend.  Comes on all of a sudden.  Her body is achy but no one else is getting sick.  She is feeling very nervous.  She is feeling better but still feeling achy and dizzy.  She hasn't been checking sugars at home.     She did not start the Rybelsus  due to insurance not covering the medication.     Relevant past medical, surgical, family and social history reviewed and updated as indicated. Interim medical history since our last visit reviewed. Allergies and medications reviewed and updated.  Review of Systems  Constitutional:  Positive for chills and fatigue.       Body aches  Musculoskeletal:  Positive for arthralgias and myalgias.  Neurological:  Positive for dizziness.    Per HPI unless specifically indicated above     Objective:    BP 136/82   Pulse 91   Ht 5' 3 (1.6 m)   Wt 194 lb (88 kg)   LMP 08/05/2015 (Approximate)   SpO2 98%   BMI 34.37 kg/m   Wt Readings from Last 3 Encounters:  04/24/24 194 lb (88 kg)   04/03/24 194 lb (88 kg)  11/29/23 201 lb (91.2 kg)    Physical Exam Vitals and nursing note reviewed.  Constitutional:      General: She is not in acute distress.    Appearance: Normal appearance. She is normal weight. She is not ill-appearing, toxic-appearing or diaphoretic.  HENT:     Head: Normocephalic.     Right Ear: External ear normal.     Left Ear: External ear normal.     Nose: Nose normal.     Mouth/Throat:     Mouth: Mucous membranes are moist.     Pharynx: Oropharynx is clear.  Eyes:     General:        Right eye: No discharge.        Left eye: No discharge.     Extraocular Movements: Extraocular movements intact.     Conjunctiva/sclera: Conjunctivae normal.     Pupils: Pupils are equal, round, and reactive to light.  Cardiovascular:     Rate and Rhythm: Normal rate and regular rhythm.  Heart sounds: No murmur heard. Pulmonary:     Effort: Pulmonary effort is normal. No respiratory distress.     Breath sounds: Normal breath sounds. No wheezing or rales.  Musculoskeletal:     Cervical back: Normal range of motion and neck supple.  Skin:    General: Skin is warm and dry.     Capillary Refill: Capillary refill takes less than 2 seconds.  Neurological:     General: No focal deficit present.     Mental Status: She is alert and oriented to person, place, and time. Mental status is at baseline.  Psychiatric:        Mood and Affect: Mood normal.        Behavior: Behavior normal.        Thought Content: Thought content normal.        Judgment: Judgment normal.     Results for orders placed or performed in visit on 04/24/24  Lyme Disease Serology w/Reflex   Collection Time: 04/24/24 11:33 AM  Result Value Ref Range   Lyme Total Antibody EIA Positive Negative  Spotted Fever Group Antibodies   Collection Time: 04/24/24 11:33 AM  Result Value Ref Range   Spotted Fever Group IgG <1:64 Neg:<1:64   Spotted Fever Group IgM <1:64 Neg:<1:64   Result Comment  Comment   Sed Rate (ESR)   Collection Time: 04/24/24 11:33 AM  Result Value Ref Range   Sed Rate 15 0 - 40 mm/hr  Lyme IgG/IgM   Collection Time: 04/24/24 11:33 AM  Result Value Ref Range   Lyme IgG EIA Negative Negative   Lyme IgM EIA Negative Negative   Lyme Interpretation Lyme Abs Unconfirmed       Assessment & Plan:   Problem List Items Addressed This Visit   None Visit Diagnoses       Other fatigue    -  Primary     Tick bite, unspecified site, initial encounter       Will rule out tick borne illnesses.  Will treat based on lab results.  If not improved can do further lab work for evaluation.   Relevant Orders   Lyme Disease Serology w/Reflex (Completed)   Spotted Fever Group Antibodies (Completed)   Sed Rate (ESR) (Completed)        Follow up plan: No follow-ups on file.

## 2024-04-25 ENCOUNTER — Ambulatory Visit: Payer: Self-pay | Admitting: Nurse Practitioner

## 2024-04-25 LAB — SPOTTED FEVER GROUP ANTIBODIES

## 2024-04-25 LAB — LYME DISEASE SEROLOGY W/REFLEX: Lyme Total Antibody EIA: POSITIVE

## 2024-04-25 LAB — LYME IGG/IGM: Lyme IgG EIA: NEGATIVE

## 2024-04-25 MED ORDER — DOXYCYCLINE HYCLATE 100 MG PO TABS
100.0000 mg | ORAL_TABLET | Freq: Two times a day (BID) | ORAL | 0 refills | Status: DC
Start: 2024-04-25 — End: 2024-05-30

## 2024-04-28 LAB — SEDIMENTATION RATE: Sed Rate: 15 mm/h (ref 0–40)

## 2024-04-28 LAB — SPOTTED FEVER GROUP ANTIBODIES: Spotted Fever Group IgM: <1:64 {titer}

## 2024-04-28 LAB — LYME IGG/IGM: Lyme IgM EIA: NEGATIVE

## 2024-05-25 ENCOUNTER — Other Ambulatory Visit: Payer: Self-pay

## 2024-05-25 DIAGNOSIS — M2550 Pain in unspecified joint: Secondary | ICD-10-CM

## 2024-05-25 DIAGNOSIS — R5383 Other fatigue: Secondary | ICD-10-CM

## 2024-05-25 NOTE — Telephone Encounter (Signed)
 Copied from CRM [id number]. Topic: Clinical - Medication Question >> May 25, 2024 12:36 PM Nathanel BROCKS wrote: Reason for CRM: Pt has been on antibiotics for lymes disease and has completed. Pt is wondering if she needs another round of antibiotics because she is style having same symptoms as before, not as bad but still there.   She is still having joint pain and it really hurts.   Please advise.

## 2024-05-25 NOTE — Telephone Encounter (Signed)
 Routing to provider to advise.

## 2024-05-28 ENCOUNTER — Other Ambulatory Visit

## 2024-05-28 MED ORDER — METFORMIN HCL 500 MG PO TABS: 500.0000 mg | ORAL_TABLET | Freq: Two times a day (BID) | ORAL | 1 refills | Status: AC

## 2024-05-28 MED ORDER — LISINOPRIL 10 MG PO TABS
10.0000 mg | ORAL_TABLET | Freq: Every day | ORAL | 1 refills | Status: AC
Start: 2024-05-28 — End: ?

## 2024-05-28 NOTE — Telephone Encounter (Signed)
 Called and notified patient of providers message. Lab appointment scheduled for tomorrow. Please enter future lab orders for the patient.   While on the phone, patient states that she need a refill on her Lisinopril  and Metformin .   Routing back to provider for orders and refills.

## 2024-05-28 NOTE — Telephone Encounter (Signed)
 Additional antibiotics would not be beneficial.  It may do more harm than good due to killing the good and the bad bacteria.  We can recheck her labs to see if the Lyme is still present.

## 2024-05-28 NOTE — Addendum Note (Signed)
 Addended by: MELVIN PAO on: 05/28/2024 10:56 AM   Modules accepted: Orders

## 2024-05-28 NOTE — Telephone Encounter (Signed)
 Orders placed.  I placed additional lab work to rule out autoimmune concern as well.

## 2024-05-29 ENCOUNTER — Other Ambulatory Visit

## 2024-05-29 DIAGNOSIS — M2550 Pain in unspecified joint: Secondary | ICD-10-CM | POA: Diagnosis not present

## 2024-05-29 DIAGNOSIS — R5383 Other fatigue: Secondary | ICD-10-CM | POA: Diagnosis not present

## 2024-05-30 ENCOUNTER — Ambulatory Visit: Payer: Self-pay | Admitting: Nurse Practitioner

## 2024-05-30 MED ORDER — DOXYCYCLINE HYCLATE 100 MG PO TABS: 100.0000 mg | ORAL_TABLET | Freq: Two times a day (BID) | ORAL | 0 refills | Status: DC

## 2024-05-31 LAB — CBC WITH DIFFERENTIAL/PLATELET
Basophils Absolute: 0.1 x10E3/uL (ref 0.0–0.2)
Basos: 1 %
EOS (ABSOLUTE): 0.3 x10E3/uL (ref 0.0–0.4)
Eos: 3 %
Hematocrit: 40.1 % (ref 34.0–46.6)
Hemoglobin: 12.8 g/dL (ref 11.1–15.9)
Immature Grans (Abs): 0 x10E3/uL (ref 0.0–0.1)
Immature Granulocytes: 0 %
Lymphocytes Absolute: 2.4 x10E3/uL (ref 0.7–3.1)
Lymphs: 27 %
MCH: 29.5 pg (ref 26.6–33.0)
MCHC: 31.9 g/dL (ref 31.5–35.7)
MCV: 92 fL (ref 79–97)
Monocytes Absolute: 0.5 x10E3/uL (ref 0.1–0.9)
Monocytes: 5 %
Neutrophils Absolute: 5.9 x10E3/uL (ref 1.4–7.0)
Neutrophils: 64 %
Platelets: 286 x10E3/uL (ref 150–450)
RBC: 4.34 x10E6/uL (ref 3.77–5.28)
RDW: 13.8 % (ref 11.7–15.4)
WBC: 9.2 x10E3/uL (ref 3.4–10.8)

## 2024-05-31 LAB — ANA+ENA+DNA/DS+ANTICH+CENTR
ANA Titer 1: NEGATIVE
Anti JO-1: <0.2 AI (ref 0.0–0.9)
Centromere Ab Screen: <0.2 AI (ref 0.0–0.9)
Chromatin Ab SerPl-aCnc: <0.2 AI (ref 0.0–0.9)
ENA RNP Ab: <0.2 AI (ref 0.0–0.9)
ENA SM Ab Ser-aCnc: <0.2 AI (ref 0.0–0.9)
ENA SSA (RO) Ab: 0.2 AI (ref 0.0–0.9)
ENA SSB (LA) Ab: <0.2 AI (ref 0.0–0.9)
Scleroderma (Scl-70) (ENA) Antibody, IgG: <0.2 AI (ref 0.0–0.9)
dsDNA Ab: <1 [IU]/mL (ref 0–9)

## 2024-05-31 LAB — EHRLICHIA ANTIBODY PANEL

## 2024-05-31 LAB — COMPREHENSIVE METABOLIC PANEL WITH GFR
ALT: 23 IU/L (ref 0–32)
AST: 18 IU/L (ref 0–40)
Albumin: 4.3 g/dL (ref 3.8–4.9)
Alkaline Phosphatase: 91 IU/L (ref 44–121)
BUN/Creatinine Ratio: 24 — ABNORMAL HIGH (ref 9–23)
BUN: 14 mg/dL (ref 6–24)
Bilirubin Total: 0.4 mg/dL (ref 0.0–1.2)
CO2: 22 mmol/L (ref 20–29)
Calcium: 9.9 mg/dL (ref 8.7–10.2)
Chloride: 96 mmol/L (ref 96–106)
Creatinine, Ser: 0.59 mg/dL (ref 0.57–1.00)
Globulin, Total: 3.4 g/dL (ref 1.5–4.5)
Glucose: 211 mg/dL — ABNORMAL HIGH (ref 70–99)
Potassium: 4.5 mmol/L (ref 3.5–5.2)
Sodium: 135 mmol/L (ref 134–144)
Total Protein: 7.7 g/dL (ref 6.0–8.5)
eGFR: 105 mL/min/1.73 (ref >59)

## 2024-05-31 LAB — ANA: Anti Nuclear Antibody (ANA): NEGATIVE

## 2024-05-31 LAB — LYME DISEASE SEROLOGY W/REFLEX: Lyme Total Antibody EIA: POSITIVE

## 2024-05-31 LAB — LYME IGG/IGM
Lyme IgG EIA: NEGATIVE
Lyme IgM EIA: NEGATIVE

## 2024-05-31 LAB — URIC ACID: Uric Acid: 4.8 mg/dL (ref 3.0–7.2)

## 2024-05-31 LAB — CK: Total CK: 40 U/L (ref 32–182)

## 2024-05-31 LAB — RHEUMATOID FACTOR: Rheumatoid fact SerPl-aCnc: 10.8 [IU]/mL (ref <14.0)

## 2024-07-09 ENCOUNTER — Ambulatory Visit: Admitting: Nurse Practitioner

## 2024-08-02 ENCOUNTER — Ambulatory Visit: Admitting: Nurse Practitioner

## 2024-08-17 NOTE — Progress Notes (Signed)
 ELLIETT GUARISCO                                          MRN: [id number]   08/17/2024   The [facility] Quality Team Specialist reviewed this patient medical record for the purposes of chart review for care gap closure. The following were reviewed: chart review for care gap closure-glycemic status assessment.    [facility] Quality Team

## 2024-09-04 ENCOUNTER — Ambulatory Visit: Admitting: Nurse Practitioner

## 2024-09-04 VITALS — BP 128/81 | HR 79 | Temp 97.7°F | Ht 63.0 in | Wt 199.2 lb

## 2024-09-04 DIAGNOSIS — E1169 Type 2 diabetes mellitus with other specified complication: Secondary | ICD-10-CM

## 2024-09-04 DIAGNOSIS — I10 Essential (primary) hypertension: Secondary | ICD-10-CM

## 2024-09-04 DIAGNOSIS — E7800 Pure hypercholesterolemia, unspecified: Secondary | ICD-10-CM

## 2024-09-04 DIAGNOSIS — A6920 Lyme disease, unspecified: Secondary | ICD-10-CM

## 2024-09-04 DIAGNOSIS — Z7984 Long term (current) use of oral hypoglycemic drugs: Secondary | ICD-10-CM

## 2024-09-04 NOTE — Assessment & Plan Note (Signed)
 Recommended eating smaller high protein, low fat meals more frequently and exercising 30 mins a day 5 times a week with a goal of 10-15lb weight loss in the next 3 months.

## 2024-09-04 NOTE — Progress Notes (Signed)
 BP 128/81 (BP Location: Right Arm, Cuff Size: Normal)   Pulse 79   Temp 97.7 F (36.5 C) (Oral)   Ht 5' 3 (1.6 m)   Wt 199 lb 3.2 oz (90.4 kg)   LMP 08/05/2015 (Approximate)   SpO2 99%   BMI 35.29 kg/m    Subjective:    Patient ID: Felicia Everett, female    DOB: January 15, 1966, 58 y.o.   MRN: [id number]  HPI: Felicia Everett is a 58 y.o. female  Chief Complaint  Patient presents with   Diabetes   Hyperlipidemia   Hypertension   HYPERTENSION Patient has not been taking her Lisinopril .  Not taking the rosuvastatin .  Hypertension status: controlled  Satisfied with current treatment? Not currently on medication Duration of hypertension: years BP monitoring frequency:  twice weekly BP range: 122/78 BP medication side effects:  no Medication compliance: not currently on medication Previous BP meds:none Aspirin: no Recurrent headaches: no Visual changes: no Palpitations: no Dyspnea: no Chest pain: no Lower extremity edema: no Dizzy/lightheaded: no  DIABETES Last A1c in January was 9.0%.  Currently on Metformin  500mg  twice daily.  She is not taking the Rybelsus . She is still walking 3-5 days per week. Hypoglycemic episodes:no Polydipsia/polyuria: no Visual disturbance: no Chest pain: no Paresthesias: yes in feet Glucose Monitoring: no  Accucheck frequency: Not Checking  Fasting glucose:  Post prandial:  Evening:  Before meals: Taking Insulin?: no  Long acting insulin:  Short acting insulin: Blood Pressure Monitoring: weekly Retinal Examination: Not up to Date- plans to make an appt Foot Exam: Up to Date Diabetic Education: Not Completed Pneumovax: Not up to Date Influenza: Not up to Date Aspirin: yes   Patient states she is having trouble with the lyme disease.  States she is having different pains each week.  Sometimes its her leg, others its her arms or neck pain.  She doesn't feel like the symptoms are too terrible right now.  She is taking ibuprofen as  needed for pain.   Relevant past medical, surgical, family and social history reviewed and updated as indicated. Interim medical history since our last visit reviewed. Allergies and medications reviewed and updated.  Review of Systems  Eyes:  Negative for visual disturbance.  Respiratory:  Negative for cough, chest tightness and shortness of breath.   Cardiovascular:  Negative for chest pain, palpitations and leg swelling.  Endocrine: Negative for polydipsia and polyuria.  Musculoskeletal:  Positive for arthralgias.  Neurological:  Positive for numbness. Negative for dizziness and headaches.    Per HPI unless specifically indicated above     Objective:     BP 128/81 (BP Location: Right Arm, Cuff Size: Normal)   Pulse 79   Temp 97.7 F (36.5 C) (Oral)   Ht 5' 3 (1.6 m)   Wt 199 lb 3.2 oz (90.4 kg)   LMP 08/05/2015 (Approximate)   SpO2 99%   BMI 35.29 kg/m   Wt Readings from Last 3 Encounters:  09/04/24 199 lb 3.2 oz (90.4 kg)  04/24/24 194 lb (88 kg)  04/03/24 194 lb (88 kg)    Physical Exam Vitals and nursing note reviewed.  Constitutional:      General: She is not in acute distress.    Appearance: Normal appearance. She is obese. She is not ill-appearing, toxic-appearing or diaphoretic.  HENT:     Head: Normocephalic.     Right Ear: External ear normal.     Left Ear: External ear normal.     Nose:  Nose normal.     Mouth/Throat:     Mouth: Mucous membranes are moist.     Pharynx: Oropharynx is clear.  Eyes:     General:        Right eye: No discharge.        Left eye: No discharge.     Extraocular Movements: Extraocular movements intact.     Conjunctiva/sclera: Conjunctivae normal.     Pupils: Pupils are equal, round, and reactive to light.  Cardiovascular:     Rate and Rhythm: Normal rate and regular rhythm.     Heart sounds: No murmur heard. Pulmonary:     Effort: Pulmonary effort is normal. No respiratory distress.     Breath sounds: Normal breath  sounds. No wheezing or rales.  Musculoskeletal:     Cervical back: Normal range of motion and neck supple.  Skin:    General: Skin is warm and dry.     Capillary Refill: Capillary refill takes less than 2 seconds.  Neurological:     General: No focal deficit present.     Mental Status: She is alert and oriented to person, place, and time. Mental status is at baseline.  Psychiatric:        Mood and Affect: Mood normal.        Behavior: Behavior normal.        Thought Content: Thought content normal.        Judgment: Judgment normal.     Results for orders placed or performed in visit on 05/29/24  Lyme Disease Serology w/Reflex   Collection Time: 05/29/24 11:48 AM  Result Value Ref Range   Lyme Total Antibody EIA Positive Negative  CBC with Differential/Platelet   Collection Time: 05/29/24 11:48 AM  Result Value Ref Range   WBC 9.2 3.4 - 10.8 x10E3/uL   RBC 4.34 3.77 - 5.28 x10E6/uL   Hemoglobin 12.8 11.1 - 15.9 g/dL   Hematocrit 59.8 65.9 - 46.6 %   MCV 92 79 - 97 fL   MCH 29.5 26.6 - 33.0 pg   MCHC 31.9 31.5 - 35.7 g/dL   RDW 86.1 88.2 - 84.5 %   Platelets 286 150 - 450 x10E3/uL   Neutrophils 64 Not Estab. %   Lymphs 27 Not Estab. %   Monocytes 5 Not Estab. %   Eos 3 Not Estab. %   Basos 1 Not Estab. %   Neutrophils Absolute 5.9 1.4 - 7.0 x10E3/uL   Lymphocytes Absolute 2.4 0.7 - 3.1 x10E3/uL   Monocytes Absolute 0.5 0.1 - 0.9 x10E3/uL   EOS (ABSOLUTE) 0.3 0.0 - 0.4 x10E3/uL   Basophils Absolute 0.1 0.0 - 0.2 x10E3/uL   Immature Granulocytes 0 Not Estab. %   Immature Grans (Abs) 0.0 0.0 - 0.1 x10E3/uL  Comprehensive metabolic panel with GFR   Collection Time: 05/29/24 11:48 AM  Result Value Ref Range   Glucose 211 (H) 70 - 99 mg/dL   BUN 14 6 - 24 mg/dL   Creatinine, Ser 9.40 0.57 - 1.00 mg/dL   eGFR 894 >40 fO/fpw/8.26   BUN/Creatinine Ratio 24 (H) 9 - 23   Sodium 135 134 - 144 mmol/L   Potassium 4.5 3.5 - 5.2 mmol/L   Chloride 96 96 - 106 mmol/L   CO2 22 20  - 29 mmol/L   Calcium  9.9 8.7 - 10.2 mg/dL   Total Protein 7.7 6.0 - 8.5 g/dL   Albumin 4.3 3.8 - 4.9 g/dL   Globulin, Total 3.4 1.5 - 4.5 g/dL  Bilirubin Total 0.4 0.0 - 1.2 mg/dL   Alkaline Phosphatase 91 44 - 121 IU/L   AST 18 0 - 40 IU/L   ALT 23 0 - 32 IU/L  Ehrlichia antibody panel   Collection Time: 05/29/24 11:48 AM  Result Value Ref Range   E.Chaffeensis (HME) IgG Negative Neg:<1:64   E. Chaffeensis ([facility]ME) IgM Titer Negative Neg:<1:20   HGE IgG Titer Negative Neg:<1:64   HGE IgM Titer Negative Neg:<1:20   Result Comment: Comment   Uric acid   Collection Time: 05/29/24 11:48 AM  Result Value Ref Range   Uric Acid 4.8 3.0 - 7.2 mg/dL  CK   Collection Time: 05/29/24 11:48 AM  Result Value Ref Range   Total CK 40 32 - 182 U/L  ANA+ENA+DNA/DS+Antich+Centr   Collection Time: 05/29/24 11:48 AM  Result Value Ref Range   ANA Titer 1 Negative    dsDNA Ab <1 0 - 9 IU/mL   ENA RNP Ab <0.2 0.0 - 0.9 AI   ENA SM Ab Ser-aCnc <0.2 0.0 - 0.9 AI   Scleroderma (Scl-70) (ENA) Antibody, IgG <0.2 0.0 - 0.9 AI   ENA SSA (RO) Ab 0.2 0.0 - 0.9 AI   ENA SSB (LA) Ab <0.2 0.0 - 0.9 AI   Chromatin Ab SerPl-aCnc <0.2 0.0 - 0.9 AI   Anti JO-1 <0.2 0.0 - 0.9 AI   Centromere Ab Screen <0.2 0.0 - 0.9 AI   See below: Comment   ANA   Collection Time: 05/29/24 11:48 AM  Result Value Ref Range   Anti Nuclear Antibody (ANA) Negative Negative  Rheumatoid factor   Collection Time: 05/29/24 11:48 AM  Result Value Ref Range   Rheumatoid fact SerPl-aCnc 10.8 <14.0 IU/mL  Lyme IgG/IgM   Collection Time: 05/29/24 11:48 AM  Result Value Ref Range   Lyme IgG EIA Negative Negative   Lyme IgM EIA Negative Negative   Lyme Interpretation Lyme Abs Unconfirmed       Assessment & Plan:   Problem List Items Addressed This Visit       Cardiovascular and Mediastinum   Hypertension   Chronic.  Controlled on current regimen of Lisinopril . Labs ordered.  Follow up in 3 months.  Call sooner if concerns  arise.         Endocrine   Type 2 diabetes mellitus with other specified complication (HCC) - Primary   Chronic.  Last A1c was 9%.  Only taking Metformin  500mg  BID.  Has not started Rybelsus  7mg .  May need Januvia or Glipizide if A1c is still elevated.  Taking ACE for kidney protection.  Taking Rosuvastatin .  Declines injectable GLP1.   Labs ordered today.  Follow up in 3 months.  Call sooner if concerns arise.       Relevant Orders   Comp Met (CMET)   HgB A1c     Other   Morbid obesity (HCC)   Recommended eating smaller high protein, low fat meals more frequently and exercising 30 mins a day 5 times a week with a goal of 10-15lb weight loss in the next 3 months.       Hypercholesteremia   Labs ordered at visit today.  Will make recommendations based on lab results.       Relevant Orders   Lipid Profile   Other Visit Diagnoses       Lyme disease       Controlled with PRN pain relieve.  Encouraged Tylenol vs Ibuprofen. May request referral to specialist in the  future.         Follow up plan: No follow-ups on file.

## 2024-09-04 NOTE — Assessment & Plan Note (Signed)
 Chronic.  Controlled on current regimen of Lisinopril . Labs ordered.  Follow up in 3 months.  Call sooner if concerns arise.

## 2024-09-04 NOTE — Assessment & Plan Note (Signed)
 Chronic.  Last A1c was 9%.  Only taking Metformin  500mg  BID.  Has not started Rybelsus  7mg .  May need Januvia or Glipizide if A1c is still elevated.  Taking ACE for kidney protection.  Taking Rosuvastatin .  Declines injectable GLP1.   Labs ordered today.  Follow up in 3 months.  Call sooner if concerns arise.

## 2024-09-04 NOTE — Assessment & Plan Note (Signed)
 Labs ordered at visit today.  Will make recommendations based on lab results.

## 2024-09-05 ENCOUNTER — Ambulatory Visit: Payer: Self-pay | Admitting: Nurse Practitioner

## 2024-09-05 LAB — COMPREHENSIVE METABOLIC PANEL WITH GFR
ALT: 23 IU/L (ref 0–32)
AST: 21 IU/L (ref 0–40)
Albumin: 4.1 g/dL (ref 3.8–4.9)
Alkaline Phosphatase: 94 IU/L (ref 49–135)
BUN/Creatinine Ratio: 26 — ABNORMAL HIGH (ref 9–23)
BUN: 23 mg/dL (ref 6–24)
Bilirubin Total: 0.3 mg/dL (ref 0.0–1.2)
CO2: 20 mmol/L (ref 20–29)
Calcium: 9.2 mg/dL (ref 8.7–10.2)
Chloride: 100 mmol/L (ref 96–106)
Creatinine, Ser: 0.87 mg/dL (ref 0.57–1.00)
Globulin, Total: 3.4 g/dL (ref 1.5–4.5)
Glucose: 262 mg/dL — ABNORMAL HIGH (ref 70–99)
Potassium: 4.4 mmol/L (ref 3.5–5.2)
Sodium: 136 mmol/L (ref 134–144)
Total Protein: 7.5 g/dL (ref 6.0–8.5)
eGFR: 78 mL/min/1.73 (ref >59)

## 2024-09-05 LAB — LIPID PANEL
Chol/HDL Ratio: 6.2 ratio — ABNORMAL HIGH (ref 0.0–4.4)
Cholesterol, Total: 235 mg/dL — ABNORMAL HIGH (ref 100–199)
HDL: 38 mg/dL — ABNORMAL LOW (ref >39)
LDL Chol Calc (NIH): 108 mg/dL — ABNORMAL HIGH (ref 0–99)
Triglycerides: 517 mg/dL — ABNORMAL HIGH (ref 0–149)
VLDL Cholesterol Cal: 89 mg/dL — ABNORMAL HIGH (ref 5–40)

## 2024-09-05 LAB — HEMOGLOBIN A1C
Est. average glucose Bld gHb Est-mCnc: 214 mg/dL
Hgb A1c MFr Bld: 9.1 % — ABNORMAL HIGH (ref 4.8–5.6)

## 2024-12-06 ENCOUNTER — Ambulatory Visit: Admitting: Nurse Practitioner

## 2024-12-07 NOTE — Progress Notes (Signed)
 NARCISSA MELDER                                          MRN: [id number]   12/07/2024   The [facility] Quality Team Specialist reviewed this patient medical record for the purposes of chart review for care gap closure. The following were reviewed: chart review for care gap closure-glycemic status assessment.    [facility] Quality Team

## 2025-01-09 ENCOUNTER — Ambulatory Visit: Admitting: Family Medicine
